# Patient Record
Sex: Female | Born: 1960 | Race: White | Hispanic: No | Marital: Married | Smoking: Never smoker
Health system: Southern US, Community
[De-identification: ages and names within clinical notes are randomized; demographics above are authoritative.]

## PROBLEM LIST (undated history)

## (undated) DIAGNOSIS — I341 Nonrheumatic mitral (valve) prolapse: Secondary | ICD-10-CM

## (undated) DIAGNOSIS — G8929 Other chronic pain: Secondary | ICD-10-CM

## (undated) DIAGNOSIS — Z87442 Personal history of urinary calculi: Secondary | ICD-10-CM

## (undated) DIAGNOSIS — I10 Essential (primary) hypertension: Secondary | ICD-10-CM

## (undated) DIAGNOSIS — R002 Palpitations: Secondary | ICD-10-CM

## (undated) DIAGNOSIS — I499 Cardiac arrhythmia, unspecified: Secondary | ICD-10-CM

## (undated) DIAGNOSIS — Z85828 Personal history of other malignant neoplasm of skin: Secondary | ICD-10-CM

## (undated) DIAGNOSIS — R32 Unspecified urinary incontinence: Secondary | ICD-10-CM

## (undated) DIAGNOSIS — T8859XA Other complications of anesthesia, initial encounter: Secondary | ICD-10-CM

## (undated) DIAGNOSIS — E039 Hypothyroidism, unspecified: Secondary | ICD-10-CM

## (undated) DIAGNOSIS — Z8679 Personal history of other diseases of the circulatory system: Secondary | ICD-10-CM

## (undated) DIAGNOSIS — Z9889 Other specified postprocedural states: Secondary | ICD-10-CM

## (undated) DIAGNOSIS — M549 Dorsalgia, unspecified: Secondary | ICD-10-CM

## (undated) DIAGNOSIS — J45909 Unspecified asthma, uncomplicated: Secondary | ICD-10-CM

## (undated) HISTORY — DX: Other specified postprocedural states: Z98.890

## (undated) HISTORY — PX: CYST REMOVAL HAND: SHX6279

## (undated) HISTORY — DX: Personal history of other diseases of the circulatory system: Z86.79

## (undated) HISTORY — PX: ABDOMINAL HYSTERECTOMY: SHX81

## (undated) HISTORY — PX: BACK SURGERY: SHX140

## (undated) HISTORY — PX: KNEE ARTHROSCOPY: SUR90

## (undated) HISTORY — PX: OTHER SURGICAL HISTORY: SHX169

## (undated) HISTORY — DX: Essential (primary) hypertension: I10

## (undated) HISTORY — PX: AUGMENTATION MAMMAPLASTY: SUR837

## (undated) HISTORY — PX: CHOLECYSTECTOMY: SHX55

## (undated) HISTORY — DX: Palpitations: R00.2

## (undated) HISTORY — DX: Unspecified urinary incontinence: R32

## (undated) HISTORY — DX: Personal history of other malignant neoplasm of skin: Z85.828

---

## 2001-12-04 ENCOUNTER — Ambulatory Visit (HOSPITAL_COMMUNITY): Admission: RE | Admit: 2001-12-04 | Discharge: 2001-12-04 | Payer: Self-pay | Admitting: Obstetrics and Gynecology

## 2001-12-04 ENCOUNTER — Encounter: Payer: Self-pay | Admitting: Obstetrics and Gynecology

## 2002-03-10 ENCOUNTER — Encounter: Payer: Self-pay | Admitting: Emergency Medicine

## 2002-03-10 ENCOUNTER — Emergency Department (HOSPITAL_COMMUNITY): Admission: EM | Admit: 2002-03-10 | Discharge: 2002-03-10 | Payer: Self-pay | Admitting: Emergency Medicine

## 2002-03-20 ENCOUNTER — Encounter (HOSPITAL_COMMUNITY): Admission: RE | Admit: 2002-03-20 | Discharge: 2002-04-19 | Payer: Self-pay | Admitting: Preventative Medicine

## 2002-05-23 ENCOUNTER — Encounter (INDEPENDENT_AMBULATORY_CARE_PROVIDER_SITE_OTHER): Payer: Self-pay | Admitting: *Deleted

## 2002-05-23 ENCOUNTER — Ambulatory Visit (HOSPITAL_BASED_OUTPATIENT_CLINIC_OR_DEPARTMENT_OTHER): Admission: RE | Admit: 2002-05-23 | Discharge: 2002-05-23 | Payer: Self-pay | Admitting: *Deleted

## 2002-12-27 ENCOUNTER — Emergency Department (HOSPITAL_COMMUNITY): Admission: EM | Admit: 2002-12-27 | Discharge: 2002-12-28 | Payer: Self-pay | Admitting: Emergency Medicine

## 2002-12-28 ENCOUNTER — Encounter: Payer: Self-pay | Admitting: Emergency Medicine

## 2003-10-08 ENCOUNTER — Ambulatory Visit (HOSPITAL_COMMUNITY): Admission: RE | Admit: 2003-10-08 | Discharge: 2003-10-08 | Payer: Self-pay | Admitting: Family Medicine

## 2004-04-02 ENCOUNTER — Ambulatory Visit (HOSPITAL_COMMUNITY): Admission: RE | Admit: 2004-04-02 | Discharge: 2004-04-02 | Payer: Self-pay | Admitting: Internal Medicine

## 2004-05-12 ENCOUNTER — Ambulatory Visit (HOSPITAL_COMMUNITY): Admission: RE | Admit: 2004-05-12 | Discharge: 2004-05-12 | Payer: Self-pay | Admitting: Internal Medicine

## 2004-05-13 ENCOUNTER — Emergency Department (HOSPITAL_COMMUNITY): Admission: EM | Admit: 2004-05-13 | Discharge: 2004-05-14 | Payer: Self-pay | Admitting: Emergency Medicine

## 2004-06-15 ENCOUNTER — Ambulatory Visit: Payer: Self-pay | Admitting: Internal Medicine

## 2004-12-14 ENCOUNTER — Ambulatory Visit (HOSPITAL_COMMUNITY): Admission: RE | Admit: 2004-12-14 | Discharge: 2004-12-14 | Payer: Self-pay | Admitting: Podiatry

## 2006-08-08 ENCOUNTER — Ambulatory Visit: Payer: Self-pay | Admitting: Internal Medicine

## 2006-08-16 ENCOUNTER — Ambulatory Visit: Payer: Self-pay | Admitting: Internal Medicine

## 2006-08-16 ENCOUNTER — Ambulatory Visit (HOSPITAL_COMMUNITY): Admission: RE | Admit: 2006-08-16 | Discharge: 2006-08-16 | Payer: Self-pay | Admitting: Internal Medicine

## 2006-09-16 ENCOUNTER — Ambulatory Visit: Payer: Self-pay | Admitting: Cardiology

## 2006-11-01 ENCOUNTER — Ambulatory Visit: Payer: Self-pay | Admitting: Cardiology

## 2006-11-01 ENCOUNTER — Ambulatory Visit (HOSPITAL_COMMUNITY): Admission: RE | Admit: 2006-11-01 | Discharge: 2006-11-01 | Payer: Self-pay | Admitting: Cardiology

## 2006-12-01 ENCOUNTER — Ambulatory Visit: Payer: Self-pay | Admitting: Cardiology

## 2007-04-05 ENCOUNTER — Ambulatory Visit (HOSPITAL_COMMUNITY): Admission: RE | Admit: 2007-04-05 | Discharge: 2007-04-05 | Payer: Self-pay | Admitting: Family Medicine

## 2007-04-24 ENCOUNTER — Ambulatory Visit (HOSPITAL_COMMUNITY): Admission: RE | Admit: 2007-04-24 | Discharge: 2007-04-25 | Payer: Self-pay | Admitting: Neurosurgery

## 2007-06-30 ENCOUNTER — Ambulatory Visit: Payer: Self-pay | Admitting: Cardiology

## 2007-08-31 ENCOUNTER — Inpatient Hospital Stay (HOSPITAL_COMMUNITY): Admission: RE | Admit: 2007-08-31 | Discharge: 2007-09-01 | Payer: Self-pay | Admitting: Neurosurgery

## 2007-10-01 ENCOUNTER — Emergency Department (HOSPITAL_COMMUNITY): Admission: EM | Admit: 2007-10-01 | Discharge: 2007-10-01 | Payer: Self-pay | Admitting: Emergency Medicine

## 2008-06-03 ENCOUNTER — Emergency Department (HOSPITAL_COMMUNITY): Admission: EM | Admit: 2008-06-03 | Discharge: 2008-06-03 | Payer: Self-pay | Admitting: Emergency Medicine

## 2008-07-23 ENCOUNTER — Other Ambulatory Visit: Admission: RE | Admit: 2008-07-23 | Discharge: 2008-07-23 | Payer: Self-pay | Admitting: Obstetrics and Gynecology

## 2009-01-09 ENCOUNTER — Telehealth (INDEPENDENT_AMBULATORY_CARE_PROVIDER_SITE_OTHER): Payer: Self-pay

## 2009-06-25 ENCOUNTER — Ambulatory Visit (HOSPITAL_COMMUNITY): Admission: RE | Admit: 2009-06-25 | Discharge: 2009-06-25 | Payer: Self-pay | Admitting: Family Medicine

## 2009-07-04 ENCOUNTER — Ambulatory Visit (HOSPITAL_COMMUNITY): Admission: RE | Admit: 2009-07-04 | Discharge: 2009-07-04 | Payer: Self-pay | Admitting: Family Medicine

## 2009-07-26 DIAGNOSIS — Z9889 Other specified postprocedural states: Secondary | ICD-10-CM

## 2009-07-26 HISTORY — DX: Other specified postprocedural states: Z98.890

## 2009-11-23 HISTORY — PX: CARDIAC CATHETERIZATION: SHX172

## 2009-11-24 ENCOUNTER — Ambulatory Visit (HOSPITAL_COMMUNITY): Admission: RE | Admit: 2009-11-24 | Discharge: 2009-11-24 | Payer: Self-pay | Admitting: Cardiology

## 2009-11-25 ENCOUNTER — Ambulatory Visit (HOSPITAL_COMMUNITY): Admission: RE | Admit: 2009-11-25 | Discharge: 2009-11-25 | Payer: Self-pay | Admitting: Cardiology

## 2009-12-17 ENCOUNTER — Encounter (INDEPENDENT_AMBULATORY_CARE_PROVIDER_SITE_OTHER): Payer: Self-pay | Admitting: Cardiology

## 2009-12-17 ENCOUNTER — Other Ambulatory Visit: Admission: RE | Admit: 2009-12-17 | Discharge: 2009-12-17 | Payer: Self-pay | Admitting: Obstetrics and Gynecology

## 2009-12-29 ENCOUNTER — Ambulatory Visit (HOSPITAL_COMMUNITY): Admission: RE | Admit: 2009-12-29 | Discharge: 2009-12-29 | Payer: Self-pay | Admitting: Family Medicine

## 2010-02-16 ENCOUNTER — Encounter (HOSPITAL_COMMUNITY): Admission: RE | Admit: 2010-02-16 | Discharge: 2010-03-18 | Payer: Self-pay | Admitting: Endocrinology

## 2010-08-16 ENCOUNTER — Encounter: Payer: Self-pay | Admitting: Family Medicine

## 2010-12-08 NOTE — H&P (Signed)
[name], [name]:  [id number]   MEDICAL RECORD NO.:  [id number]          PATIENT TYPE:  INP   LOCATION:  3035                         FACILITY:  [facility]   PHYSICIAN:  Hewitt Shorts, M.D.DATE OF BIRTH:  April 08, 1961   DATE OF ADMISSION:  08/31/2007  DATE OF DISCHARGE:                              HISTORY & PHYSICAL   HISTORY OF PRESENT ILLNESS:  The patient is a 50 year old right-handed  white female who has been a patient of mine for nearly 17 years.  She  underwent a right L5-S1 diskectomy in April 1992.  She suffered a  recurrent disk herniation and underwent diskectomy again in September  2008.  Unfortunately, although she did well initially, she suffered  recurrent radicular pain once again, this time not only across her low  back but also into both lower extremities.  MRI was repeated and  revealed a recurrent right L5-S1 lumbar disk herniation.  X-rays with  flexion and extension views show dynamic fishmouthing of the anterior  aspect of the L5-S1 disk space, and she is readmitted for L5-S1 lumbar  decompression plus POA.   PAST MEDICAL HISTORY:  Notable for history of hypertension.  She was  started on treatment  last year.  No history of myocardial infarction,  cancer, stroke, diabetes, peptic ulcer disease, or lung disease.  Previous surgeries include two right L5-S1 diskectomies in April 1992  and September 2008.  She had knee surgery and gallbladder surgery.  She  has intolerances but not allergies to morphine and Phenergan, both of  which cause nausea and vomiting.   CURRENT MEDICATIONS:  Include Benicar 20 mg daily for hypertension,  loratadine 10 mg daily for allergies, and pain medication.   FAMILY HISTORY:  Parents are both disabled.  Mother is age 92 and father  is age 61.  Father had open heart surgery, neck surgery, and back  surgery.  There is also family history of hypertension.   SOCIAL HISTORY:  The patient is married.  She is  not working.  She does  care for her 79-month-old granddaughter.  She does not smoke nor does  she have any history of substance abuse.   REVIEW OF SYSTEMS:  Notable for as described in the history of present  illness and past medical history but a 14-point review of systems is  otherwise unremarkable.   PHYSICAL EXAMINATION:  GENERAL:  The patient is a well-developed and  well-nourished white female, in no acute distress.  VITAL SIGNS:  Temperature 98.2, pulse 72, blood pressure 130/88,  respiratory rate 16, height 5 feet 5 inches, and weight 138 pounds.  LUNGS:  Clear to auscultation.  She has symmetrical respiratory  excursion.  HEART:  Regular rate and rhythm.  Normal S1 and S2.  There is no murmur.  ABDOMEN:  Soft and nondistended.  Bowel sounds are present.  EXTREMITIES:  Show no clubbing, cyanosis, or edema.  MUSCULOSKELETAL:  Musculoskeletal examination shows limitation in  mobility due to discomfort.  NEUROLOGIC:  Neurologic examination shows 5/5 strength in lower  extremities including the iliopsoas, quadriceps, dorsal, extensor,  hallucis longus,  and plantar flexors bilaterally, although she does tend  to give away with the right lower extremity due to pain.  Sensation is  intact to pinprick.  Reflexes are minimal in the biceps, brachialis,  triceps, 1 in the quadriceps, and minimal in gastrocnemius, symmetric  bilaterally.  Toes are downgoing bilaterally.   IMPRESSION:  Recurrent right L5-S1 lumbar disk herniation with evidence  of instability at L5-S1.   PLAN:  The patient admitted for a bilateral L5-S1 lumbar laminotomy,  facetectomy, diskectomy, posterior lumbar interbody fusion, and  posterolateral arthrodesis with interbody implants, posterior  instrumentation, and bone graft.  We discussed alternatives of the  surgery, alternative surgical procedures, difficulties with surgery,  hospital stay, overall occupational limitations, postoperative need for  possible  immobilization due to lumbar corset and risks including risk of  infection, bleeding, possibility of transfusion, risk of nerve root  dysfunction, pain, weakness, numbness or paresthesias, and the risk of  dural tear, CSF leakage, possible need for further surgery, anesthetic  risk, myocardial infarction, stroke, or even death.  Understanding all  these, she wishes to go ahead with surgery and is admitted for such.      Hewitt Shorts, M.D.  Electronically Signed     RWN/MEDQ  D:  08/31/2007  T:  08/31/2007  Job:  [id number]

## 2010-12-08 NOTE — Letter (Signed)
June 30, 2007    Patrica Duel, M.D.  [address]  [place], [place] [address]   RE:  [name], [name]  MRN:  [id number]  /  DOB:  [birth year]   Dear [name]:   [name] returns to the office for continued assessment and  treatment of hypertension and chest pain. The latter is not currently a  problem. She is complaining more of easy fatigability and malaise. This  has followed repeat lumbosacral spine surgery in September 2008 and an  episode of sinusitis approximately a month ago. She has not returned to  her usual activity. She feels as if she has gained weight.   CURRENT MEDICATIONS:  1. Benicar.  2. Loratadine.   PHYSICAL EXAMINATION:  VITAL SIGNS:  Blood pressure 140/70 in both arms,  although the pressure was 150 palpable in the right arm.  CARDIAC:  There is a split first heart sound and a modest systolic  ejection murmur.  LUNGS:  Clear.  EXTREMITIES:  There is no peripheral edema.   IMPRESSION:  [name] is doing well overall. Her fatigue is probably  related to physical inactivity and recovering from multiple medical  issues. She will try to gradually increase her activity. We will check a  TSH and a chemistry profile. A recent CBC was normal. Blood pressure  control appears adequate at the present time. The patient is concerned  whenever her systolics decrease below 130, so I think this is about  where we need to be.   Please let me know at any time that I can offer further assistance in  this care of this nice young woman.    Sincerely,      Gerrit Friends. Dietrich Pates, MD, [facility]  Electronically Signed    RMR/MedQ  DD: 06/30/2007  DT: 07/01/2007  Job #: [id number]

## 2010-12-08 NOTE — Op Note (Signed)
[name], [name]               ACCOUNT NO.:  [id number]   MEDICAL RECORD NO.:  [id number]          PATIENT TYPE:  OIB   LOCATION:  5114                         FACILITY:  [facility]   PHYSICIAN:  Hewitt Shorts, M.D.DATE OF BIRTH:  11-Oct-1960   DATE OF PROCEDURE:  DATE OF DISCHARGE:                               OPERATIVE REPORT   PREOPERATIVE DIAGNOSES:  1. Right L5-S1 recurrent lumbar disk herniation.  2. Lumbar degenerative disk disease.  3. Lumbar spondylosis.  4. Lumbar radiculopathy.   POSTOPERATIVE DIAGNOSES:  1. Right L5-S1 recurrent lumbar disk herniation.  2. Lumbar degenerative disk disease.  3. Lumbar spondylosis.  4. Lumbar radiculopathy.   PROCEDURE:  Right L5-S1 laminotomy and microdiskectomy with  microdissection.   SURGEON:  Hewitt Shorts, M.D.   ASSISTANT:  Jerald Kief, NP and Cristi Loron, M.D.   ANESTHESIA:  General endotracheal.   INDICATIONS FOR PROCEDURE:  Patient is a 50 year old woman who is 16  years status post a right L5-S1 lumbar diskectomy.  She did well from  that.  She developed recurrent radicular pain and was found to have a  recurrent disk herniation.  A decision was made to proceed with  laminotomy and microdiskectomy.   PROCEDURE:  Patient was brought to the operating room and placed under  general endotracheal anesthesia.  The patient was returned to a prone  position.  Lumbar region was prepped with Betadine and saline solution  and draped in a sterile fashion.  The midline was infiltrated with local  anesthetic with epinephrine.  An x-ray was taken. The L5-S1 level was  identified and a midline incision was made at the L5-S1 level and  carried down through the subcutaneous tissue.  Bipolar cautery and  electrocautery was used to maintain hemostasis.  Dissection was carried  down to the lumbar fascia was incised.  On the right side of the  midline, the paraspinal muscles were dissected  from the spinous  processes and  lamina in a subperiosteal fashion.  We identified the L5-  S1 interlaminar space and then x-ray was taken to confirm the  localization and then the microscope was draped and brought into field  to provide additional magnification, illumination and visualization.  The remainder of the decompression was performed using microdissection  and microsurgical technique.  We carefully dissected the scar tissue  from the lamina of L5 and S1 and we defined the previous laminotomy  defect.  We began to dissect the scar tissue from the edges of the  laminotomy and then we extended the laminotomy rostrally with the XMax  drill and Kerrison punches.  We got a fresh dissection plane and we were  able to dissect caudally, exposing the thecal sac and exiting right S1  nerve root.  These structures were dissected from the residual epidural  scar and mobilized medially.  We identified the disk herniation.  The  remaining scar tissue over the retained fragments was incised and the  fragment extruded.  Using a microhook, we were able to further remove  additional fragments of disk material.  Then we entered into the disk  space.  It was narrowed posteriorly.  We, using a variety of pituitary  rongeurs, completed a thorough diskectomy of all loose fragments of disk  material from both the disk spaces and the epidural space.  Good  decompression of the thecal sac and nerve root was achieved.  Hemostasis  was accomplished with the use of bipolar cautery and Gelfoam soaked in  thrombin.  The Gelfoam was subsequently removed.  The wound was  irrigated.  Hemostasis was confirmed.  We again examined the epidural  space and disk space.  All loose fragments of disk material were removed  and then we instilled 2 mL of Fentanyl, 80 mg of Depo-Medrol into the  epidural space and proceeded with closure.  The deep fascia was closed  with interrupted undyed 1 Vicryl sutures.  Scarpa's fascia was closed  with interrupted  undyed 1 Vicryl sutures.  The subcutaneous and  subcuticular layer were closed with interrupted inverted 2-0 undyed  Vicryl sutures and skin was reapproximated with Dermabond.  The  procedure was tolerated the well.  The estimated blood loss was less  than 25 mL.  Sponge and needle count were correct.  Following surgery,  the patient was turned back into the supine position to be reversed from  the anesthetic, extubated, and transferred to the recovery room for  further care.      Hewitt Shorts, M.D.  Electronically Signed     RWN/MEDQ  D:  04/24/2007  T:  04/24/2007  Job:  [id number]   cc:   Hewitt Shorts, M.D.

## 2010-12-08 NOTE — Op Note (Signed)
[name], [name]:  [id number]   MEDICAL RECORD NO.:  [id number]          PATIENT TYPE:  INP   LOCATION:  3035                         FACILITY:  [facility]   PHYSICIAN:  Hewitt Shorts, M.D.DATE OF BIRTH:  May 05, 1961   DATE OF PROCEDURE:  DATE OF DISCHARGE:                               OPERATIVE REPORT   PREOPERATIVE DIAGNOSES:  1. Recurrent right L5-S1 lumbar disk herniation.  2. Lumbar degenerative disk disease.  3. Lumbar spondylosis.  4. Lumbar radiculopathy.   POSTOPERATIVE DIAGNOSES:  1. Recurrent right L5-S1 lumbar disk herniation.  2. Lumbar degenerative disk disease.  3. Lumbar spondylosis.  4. Lumbar radiculopathy.   PROCEDURE:  Right L5-S1 lumbar laminotomy, facetectomy, foraminotomy  with decompression of the right L5 and right S1 nerve roots with  microdissection, right L5-S1 transverse posterior lumbar interbody  fusion with AVS PEEK interbody implant and mosaic with bone marrow  aspirate and, bilateral L5-S1 posterolateral arthrodesis with Tectonix  post instrumentation locally harvested, morselized autograft and mosaic  with bone marrow aspirate.   SURGEON:  Hewitt Shorts, M.D.   ASSISTANT:  Tanya Nones. Jeral Fruit, M.D.   ANESTHESIA:  General endotracheal.   INDICATIONS:  The patient is a 50 year old woman who suffered a  recurrent right L5-S1 lumbar disk herniation.  Her previous surgery was  on September 2008 and 1992.  Her x-rays revealed dynamic motion at the  L5-S1 disk space, and a decision was made to proceed with decompression  and arthrodesis.   PROCEDURE:  The patient was brought to the operating room, turned to the  prone position, lumbar region was prepped with Betadine scrub and  solution, draped with a sterile fashion.  The midline was infiltrated  with local anesthetic with epinephrine and a midline incision was made  over the L5-S1 level and carried down through the subcutaneous tissue.  Bipolar  electrocautery was used to maintain hemostasis.  The dissection  was carried out from the lumbar fascia which was incised bilaterally,  and then the paraspinal muscles were dissencted from the spinous  processes and lamina in a subperiosteal fashion.  The right L5 S1  laminotomy was noted, and we carefully dissected around the laminotomy  and then we defined the facet joints bilaterally and with magnification  and microdissection.  The right L5-S1 laminotomy was extended rostraly  and caudally, and a medial facetectomy was performed as well.  We were  able to carefully dissect the scar tissue in the epidural space and  exposed the thecal sac and exiting right L5 and S1 nerve roots.  We then  dissected in the epidural space to the disk space and entered into the  disk space and began a discectomy, the disk space was markedly narrowed.  As we did so, we were able to gradually remove the herniated fragment  that was embedded within the scar tissue more medially, and thereby  decompressed the right L5 and right S1 nerve roots as well as thecal  sac.  We then spent an extensive amount of time, continuing the  discectomy and preparing the disk space for interbody fusion.  There was  marked spondylitic degeneration, this was removed using pituitary  rongeurs and Epstein curettes, Paddle curettes and the XMax drill.  We  then began to size the disk space, initially sizing a 7 implant,  eventually an 8 implant, and finally a 9 mm implant.  We then brought in  the C-arm fluoroscope and identified pedicle entry sites, the left S1  pedicle was probed and we aspirated bone marrow aspirate from the S1  vertebral body, and it was injected over 50 mL strip of Mozaic and then  a 9 mm x 25 mm x 4 degree PEEK implant and was packed with mosaic with  bone marrow aspirate, and then we carefully positioned the implant in  the intervertebral disk space and carefully retracted the thecal sac and  nerve roots.   The implant was carefully turned and then we packed  additional mosaic with bone marrow aspirate in the intervertebral disk  space in and around the PEEK implant.   We then continued with a posterolateral arthrodesis with C-arm  fluoroscopic guidance identifying the pedicle entry sites at L5 and S1  bilaterally.  We selected a short 1 level Techtonic plate to place  screws, first at the L5 level each pedicle was probed, examined of the  ball probe, good bony surfaces were noted, it was tapped for a 6-mm  screw, and then we placed a 6 mm x 40 mm screws at the L5 level,  fastened through the upper hole of the plate, these were tightened down  and  we positioned the lower portion of the plate over the sacrum.  We  then selected the pedicle entry sites in the sacrum, each these was  probed, examining the ball probe with good bony surfaces were noted, we  then tapped and them and then again examined the ball probe and then  placed 6 x 30 mm screws at the sacrum.  All four screws were fully  tightened down and then locking caps were placed over the screws and  these were tightened with a torque wrench.  We had previously  decorticated the facet joint and lamina on the left side as well as the  facet joints on the right side, these were packed with mosaic with bone  marrow aspirate as well as morselized locally harvested autograft that  had been obtained while performing the laminectomy.  We then proceeded  with closure.  The wounds had been irrigated numerous times in the  procedure, initial with saline solution, subsequently with bacitracin  solution.  The deep fascia closed with interrupted untied with 1 Vicryl  suture, the subcutaneous subcuticular was closed with interrupted  inverted 2-0 Vicryl sutures, and the skin edges were approximated with  Dermabond.  The procedure tolerated well.  Estimated blood loss was 250  mL.  We did use a Cell-Saver during the procedure, but the Cell-Saver   technician felt that there was insufficient blood loss to process the  collected blood.  Sponge and needle count were correct.  Following the  surgery, the patient was turned back to supine position to be reversed  of the anesthetic, extubated and transferred to recovery room for the  care.      Hewitt Shorts, M.D.  Electronically Signed     RWN/MEDQ  D:  08/31/2007  T:  09/01/2007  Job:  [id number]

## 2010-12-11 NOTE — Letter (Signed)
November 01, 2006    Patrica Duel, M.D.  [address]  [place], [place] [address]   RE:  MARVETTA, VOHS  MRN:  [id number]  /  DOB:  1960-07-30   Dear Loraine Leriche:   Ms. Liberati returned to the office for continued assessment and  treatment of chest discomfort, dyspnea, and hypertension.  Since last  visit, she has done generally well.  She describes herself as extremely  active during the course of the day.  She has had no recurrent chest  discomfort.  The sensation of dyspnea continues to be limited to times  when she is singing in church.   She brings in a list of blood pressures that have been generally well  controlled.  She developed orthostatic light-headedness while taking  hydrochlorothiazide, which was discontinued.  She is now taking Benicar  20 mg daily.  Her only other medications are loratadine and a course of  azithromycin.   EXAM:  Pleasant, trim woman in no acute distress.  Weight is 129, 2 pounds more than at her last visit.  Blood pressure  150/90 in the right arm and 158/90 in the left arm sitting.  Heart rate  80 and regular.  Blood pressure in her leg is 160/92.  NECK:  No jugular venous distension.  LUNGS:  Clear.  CARDIAC:  Normal first and second heart sounds; minimal systolic  ejection murmur.   IMPRESSION:  Ms. Maves has hypertension that still is not optimally  controlled.  She does not likely have a diagnosable etiology for her  elevated blood pressure.  There is an appreciable discrepancy in blood  pressure between the 2 arms, but this is not dramatic.  In any case,  aortic coarctation would likely result in a lower blood pressure in the  left arm rather than in the right arm.  She is unlikely to have primary  vascular disease at her age with no significant risk factors, other than  new onset hypertension.  A chest x-ray will be obtained to rule out  intrathoracic disease.  She will increase her dose of Benicar to 20 mg  daily.  If this is  not effective, we will add 6.25 mg of diuretic, which  should not result in orthostatic issues.  Ms. Verbeek will continue to  monitor blood pressure at home and return to see the nurse in 1 month.  I will see her again in 6 months.  A urinalysis is also pending.  Other  laboratories were performed in your office and are negative.    Sincerely,      Gerrit Friends. Dietrich Pates, MD, [facility]  Electronically Signed    RMR/MedQ  DD: 11/01/2006  DT: 11/01/2006  Job #: [id number]

## 2010-12-11 NOTE — Op Note (Signed)
   NAMEYICEL, SHANNON                         ACCOUNT NO.:  [id number]   MEDICAL RECORD NO.:  [id number]                   PATIENT TYPE:  AMB   LOCATION:  DSC                                  FACILITY:  [facility]   PHYSICIAN:  Lowell Bouton, M.D.      DATE OF BIRTH:  1960-11-09   DATE OF PROCEDURE:  05/23/2002  DATE OF DISCHARGE:                                 OPERATIVE REPORT   PREOPERATIVE DIAGNOSIS:  Mass, right hand.   POSTOPERATIVE DIAGNOSIS:  Mass, right hand.  Contusion of sensory nerve,  right hand.   PROCEDURE:  Excision of mass, right hand.   SURGEON:  Lowell Bouton, M.D.   ANESTHESIA:  0.5% Marcaine local with sedation.   FINDINGS:  The patient had some thickened fat and neuroma-type tissue from a  contused sensory nerve. There was no evidence of any ganglion cyst or gross  abnormality.   DESCRIPTION OF PROCEDURE:  Under 0.5% Marcaine local anesthesia with a  tourniquet on the right arm, the right hand was prepped and draped in the  usual sterile fashion and after exsanguinating the limb, the tourniquet was  inflated to 250 mmHg.  A longitudinal incision was made over the fifth  metacarpal on the dorsum of the hand. Blunt dissection was carried through  the subcutaneous tissue and bleeding points were coagulated.  Blunt  dissection was carried down to the area of fullness and some subcutaneous  fat was removed.  There was no ganglion cyst identified and there was a  dorsal sensory nerve that had a small neuroma on a branch. This was excised  and sent to the lab.  Further evaluation did not identify any further masses  around the extensor tendon, even down to the fifth and fourth metacarpals.  The wound was then irrigated with saline and the skin was closed with a 3-0  subcuticular Prolene.  Steri-Strips were applied followed by a sterile  dressing.  The patient tolerated the procedure well and went to the recovery  room awake, stable, and in  good condition.                                               Lowell Bouton, M.D.    EMM/MEDQ  D:  05/23/2002  T:  05/23/2002  Job:  [id number]

## 2010-12-11 NOTE — Consult Note (Signed)
[name], [name]               ACCOUNT NO.:  [id number]   MEDICAL RECORD NO.:  [id number]           PATIENT TYPE:  AMB   LOCATION:  DAY                           FACILITY:  [facility]   PHYSICIAN:  R. Roetta Sessions, M.D. DATE OF BIRTH:  1960/08/08   DATE OF CONSULTATION:  DATE OF DISCHARGE:                                 CONSULTATION   REASON FOR CONSULTATION:  Hematochezia.   [name] is a pleasant 50 year old Caucasian female who has  noted some low volume hematochezia when she has a bowel movement over  the past 3 or 4 episodes in the past couple of months.  She denies  constipation, has one bowel movement daily.  Occasionally has crampy  right mid abdominal pain in association with eating and having a bowel  movement, sometimes she gets diaphoretic, but once she has the bowel  movement the pain instantly goes away.  She has a history of symptomatic  sphincter of Oddi dysfunction for which she underwent an ERCP with  sphincterotomy at [facility] in 2005.  Procedure was complicated by  pancreatitis which she managed at home.   She likened this intermittent pain somewhat to the symptoms she had  related to her sphincter of Oddi dysfunction.  Her gallbladder is out.  She has a positive family history of colon cancer in two second degree  relatives, in maternal grandfather and paternal grandmother diagnosed in  either their 12's or 45's.  She has never had her lower GI tract imaged.  She denies odynophagia, dysphagia or left sided reflux symptoms, nausea  or vomiting.  Her weight is down 2 pounds from what it was when we  originally saw her in October, 2005.   PAST MEDICAL HISTORY:  Unremarkable otherwise.   She has had multiple surgeries:  1. ERCP with sphincterotomy.  2. Right hand.  3. Left knee.  4. Right foot.  5. Back.  6. Hysterectomy.  7. Cholecystectomy.   CURRENT MEDICATIONS:  OTC __________ p.r.n.   ALLERGIES:  1. MORPHINE.  2. PHENERGAN.   FAMILY  HISTORY:  As outlined above.  Grandmother also ovarian carcinoma.  One grandparent had metastatic melanoma.  Mother has hypertension.  Father has some type of connective tissue disease.   SOCIAL HISTORY:  The patient has been married for 23 years.  She has one  healthy daughter and a granddaughter.  No tobacco, alcohol or drug use.   REVIEW OF SYSTEMS:  No chest pain, dyspnea on exertion.  No fever or  chills.  __________ urine.  She has had no melena.  She denies diarrhea.   EXAM:  Today she looks well.  Weight 126, height 5 feet 5 inches, temp  98, BP 118/82, pulse 80.  Skin warm and dry.  HEENT:  No scleral icterus.  CHEST:  Lungs are clear to auscultation.  CARDIAC:  Regular rate and rhythm without murmur, gallop or rub.  ABDOMEN:  Nondistended, positive bowel sounds.  Soft, nontender, without  appreciable mass or organomegaly.  EXTREMITY:  No edema.  RECTAL:  Deferred until to the time of colonoscopy.  IMPRESSION:  [name] is a pleasant 50 year old lady with  intermittent low volume hematochezia recently in the setting of two  second degree relatives with colorectal cancer at a relatively young age  and this lady needs to have a colonoscopy.  I discussed this approach  with [name].  The potential risks, benefits and alternatives have  been reviewed, she is agreeable.  Will plan to perform colonoscopy  forthwith.  She has had some nonspecific right sided abdominal pain  temporarily relieved with a bowel movement over the past few months.  This does not really sound like recurrent biliary symptoms to me,  although Mrs. Barraclough is wondering about it, will go ahead and at least  do some baseline laboratories today and see what is found at colonoscopy  then make further recommendations.   I want to thank Dr. Yetta Numbers for letting me see this very nice lady.      Jonathon Bellows, M.D.  Electronically Signed     RMR/MEDQ  D:  08/08/2006  T:  08/08/2006   Job:  [id number]   cc:   Kirk Ruths, M.D.  Fax: [phone]

## 2010-12-11 NOTE — H&P (Signed)
NAMEANAILY, [name]               ACCOUNT NO.:  [id number]   MEDICAL RECORD NO.:  [id number]          PATIENT TYPE:  AMB   LOCATION:  DAY                           FACILITY:  [facility]   PHYSICIAN:  Oley Balm. Pricilla Holm, D.P.M.DATE OF BIRTH:  01-13-1961   DATE OF ADMISSION:  12/14/2004  DATE OF DISCHARGE:  LH                                HISTORY & PHYSICAL   HISTORY OF PRESENT ILLNESS:  [name] is a 50 year old white female who  has had a chief complaint of painful bunion deformity of her right foot.  The patient relates that she has difficulty wearing enclosed shoes.  She has  pain over the first MTP, both dorsally and medially.   PREVIOUS HOSPITALIZATIONS AND SURGERY:  1.  Childbirth times one.  2.  Hysterectomy.  3.  Cholecystectomy.  4.  Knee surgery times one.   MEDICATIONS:  She takes vitamins.   ALLERGIES:  MORPHINE.   TRANSFUSIONS:  No transfusions or hepatitis.   REVIEW OF SYSTEMS:  Uneventful.   PHYSICAL EXAMINATION:  LOWER EXTREMITIES:  Palpable pedal pulses, both DP  and PT, with spontaneous capillary filling time.  NEUROLOGIC:  Essentially within normal limits.  MUSCULOSKELETAL:  Pain to palpation over the first MTP of the right foot.  She has pain both medially and dorsally.   X-rays reveal not a significant hallux valgus deformity, but on standing  clinically she does have protrusion of the first metatarsal head.   ASSESSMENT:  Hallux valgus deformity, right foot.   PLAN:  The patient to undergo McBride bunionectomy.  Reviewed the procedure  with the patient, including complications of the procedure such as  infection, bone infection, postoperative pain, swelling, etc.  The patient  seems to understand the same, and again surgery has been scheduled for Dec 14, 2004.       DBT/MEDQ  D:  12/13/2004  T:  12/13/2004  Job:  [id number]

## 2010-12-11 NOTE — Letter (Signed)
September 16, 2006    Lazaro Arms, M.D.  [address]  [place], [place] [address]   RE:  TRENELL, MOXEY  MRN:  [id number]  /  DOB:  23-Jun-1961   Dear Franky Macho:   It was pleasure assessing Ms. Parlier in the office today at your  request.  As you know, the nice woman has recently been noted to have  mild hypertension.  She also has a one year history of chest discomfort.  She describes a pressure and burning sensation at the left costal margin  that radiates through to the back.  There is no associated dyspnea nor  diaphoresis.  She has had nausea accompany these episodes.  The most  recent was approximately a month ago when she lay on the floor for 3  hours before she felt like she could resume her usual activity.   Ms. Sando reports a number of other issues.  She notes a sense of  dyspnea when she sings in choir.  She has had intermittent headaches.  She had an episode of numbness over the left face and neck.   She reports having a generally active lifestyle, doing all of her  housework and yard work, as well as frequently chasing after kids.   Past medical history is notable for a number of surgeries.  She has had  a hysterectomy but believes that both her cervix and ovaries were left  in place.  She has undergone cholecystectomy, L5-S1 fusion and  orthopedic procedures on her knee and right hand.   An allergy to MORPHINE is reported.  The patient takes no medications  routinely.  She developed breast cysts when she took a course of  estrogen some years ago.   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  Frequently spends  time with her young grandchild.   FAMILY HISTORY:  Father is a patient of mine who has undergone CABG  surgery.  Mother is alive and healthy.  Brother is alive and healthy but  has hypertension.   REVIEW OF SYSTEMS:  Notable for the need for corrective lenses,  occasional palpitations, a history of sludging in her gallbladder that  required ERCP, and presumably papillotomy.   PHYSICAL EXAMINATION:  On exam, youthful appearing, trim woman in no  acute distress.  The weight is 127, blood pressure 140/100, heart rate  75 and regular, respirations 16.  HEENT:  Normal funduscopic exam; normal lids and conjunctiva; normal  oral mucosa.  NECK:  No jugular venous distention; normal carotid upstrokes without  bruits.  ENDOCRINE:  No thyromegaly.  HEMATOPOIETIC:  No adenopathy.  SKIN:  No significant lesions.  CARDIAC:  Normal first and second heart sounds; modest systolic ejection  murmur.  LUNGS:  Clear.  ABDOMEN:  Soft and nontender; no masses; no organomegaly; normal bowel  sounds; small healed incision in the umbilicus.  EXTREMITIES:  Normal distal pulses; no edema.  NEUROMUSCULAR:  Symmetric strength and tone; normal cranial nerves.   EKG:  Normal sinus rhythm; borderline left atrial abnormality; right  ventricular conduction delay; otherwise unremarkable.   IMPRESSION:  Ms. Cottone has minimal risk.  She experienced symptoms a  few years ago that were compatible with the onset of menopause.  She  also has mild hypertension.  She will begin to take the diuretic that  you recently prescribed for her to treat the latter.  She may well need  another drug -- addition of an ACE inhibitor or ARB would be  appropriate.   Her symptoms of chest and back discomfort associated with nausea sound  more GI than cardiac.  Moreover, her risk of coronary disease is quite  low, and she has no exertional symptoms.  Leading diagnoses would be IBS  and esophageal spasm.  We will treat her empirically with antacids and  sublingual nitroglycerine for recurrent episodes.  I will reassess this  nice woman's symptoms in 6 weeks.  Thanks for sending her to me.    Sincerely,     Gerrit Friends. Dietrich Pates, MD, [facility]  Electronically Signed   RMR/MedQ  DD: 09/16/2006  DT: 09/16/2006  Job #: [id number]   CC:    Patrica Duel, M.D.

## 2010-12-11 NOTE — Op Note (Signed)
[name], [name]               ACCOUNT NO.:  [id number]   MEDICAL RECORD NO.:  [id number]          PATIENT TYPE:  AMB   LOCATION:  DAY                           FACILITY:  [facility]   PHYSICIAN:  R. Roetta Sessions, M.D. DATE OF BIRTH:  [birth year]   DATE OF PROCEDURE:  08/16/2006  DATE OF DISCHARGE:                               OPERATIVE REPORT   PROCEDURE:  Colonoscopy with ileoscopy, diagnostic.   INDICATIONS FOR PROCEDURE:  50 year old lady with intermittent low-  volume hematochezia in the setting of second-degree relative with colon  cancer.  She also has had some nonspecific right-sided abdominal pain  temporally revealed with having a bowel movement over the past few  months.  It does not look like she has had any recurrent biliary symptom  which she has had in the past.  Colonoscopy is now being done to further  evaluate her hematochezia.  This approach has been discussed with the  patient at length.  The potential risks, benefits, and alternatives have  been reviewed and questions answered.  She is agreeable.  Please see the  documentation in the medical record.   PROCEDURE NOTE:  O2 saturation, blood pressure, pulse, and respirations  were monitored throughout the entire procedure.  Conscious sedation was  with Versed 5 mg IV and Demerol 75 mg IV in divided doses.  The  instrument used was the Pentax video chip system.   FINDINGS:  Digital rectal exam revealed no abnormalities.   ENDOSCOPIC FINDINGS:  The prep was adequate.  The colonic mucosa was  surveyed from the rectosigmoid unction through the left, transverse,  right colon to the area of the appendiceal orifice, ileocecal valve, and  cecum.  These structures well-seen and photographed for the record.  The  terminal ileum was intubated to 10 cm.  From this level the scope was  slowly and cautiously withdrawn.  All previously mentioned mucosal  surfaces were again seen.  The colonic mucosa appeared normal.  The  terminal ileal mucosa appeared normal.  The scope was pulled down to the  rectum where thorough examination of the rectal mucosa and retroflex  view of the anal verge was undertaken.  The patient had anal canal  hemorrhoids; otherwise, the rectal mucosa appeared normal.  The patient  tolerated the procedure well and was reactive in endoscopy.   IMPRESSION:  Anal hemorrhoids; otherwise, normal rectum, colon, and  terminal ileum.   RECOMMENDATIONS:  1. Daily Benefiber fiber supplement, one tablespoon daily.  2. Hemorrhoid literature provided to Ms. Yehuda Mao.  3. A 10-day course of Anusol AC suppositories, one per rectum at      bedtime.  4. She is to have a screening colonoscopy when she turns 52.  5. Followup appointment with [place] in the office in 6 weeks.   ADDENDUM:  From our office, her H&H was 14.2 and 44.0, white count 3.2,  MCV 95.  LFTs:  Normal amylase, lipase at 50 and less than 10.      R. Roetta Sessions, M.D.  Electronically Signed     RMR/MEDQ  D:  08/16/2006  T:  08/16/2006  Job:  [id number]   cc:   Patrica Duel, M.D.  Fax: [phone]

## 2010-12-11 NOTE — Op Note (Signed)
NAMEJARELI, [name]               ACCOUNT NO.:  [id number]   MEDICAL RECORD NO.:  [id number]          PATIENT TYPE:  AMB   LOCATION:  DAY                           FACILITY:  [facility]   PHYSICIAN:  Oley Balm. Pricilla Holm, D.P.M.DATE OF BIRTH:  1961/07/23   DATE OF PROCEDURE:  12/14/2004  DATE OF DISCHARGE:                                 OPERATIVE REPORT   PREOPERATIVE DIAGNOSIS:  Hallux valgus deformity of the right foot, hallux  limitus deformity.   POSTOPERATIVE DIAGNOSIS:  Hallux valgus deformity of the right foot, hallux  limitus deformity.   PROCEDURE:  McBride bunionectomy with removal of cartilaginous osteophyte,  first metatarsophalangeal joints.   SURGEON:  Oley Balm. Pricilla Holm, D.P.M.   ANESTHESIA:  Monitored anesthesia care.   INDICATION FOR PROCEDURE:  Longstanding history of pain, first MTP.   Patient brought in the operating room and placed on the operating table in  the supine position.  The patient's lower right foot and leg was then  prepped and draped in the usual aseptic manner.  Then with an ankle  tourniquet placed and well-padded to prevent contusion and elevated at 250  mmHg, exsanguination of the right foot, the following surgical procedures  were then performed under monitored anesthesia care.   McBRIDE BUNIONECTOMY, RIGHT FOOT:  Attention directed to the dorsal medial  aspect of the right first MTP, where a linear incision made.  The incision  widened and deepened via sharp and blunt dissection, being sure to identify  and retract all vital structures.  A capsular incision was made in the  intermetatarsal crease soft tissue attachments dorsally and medially.  Then  utilizing a Zimmer oscillating saw, the medial limits on the medial aspect  of the first metatarsal was resected.  It should be noted that the  osteophyte formation that was dorsal was resected and again this appeared to  all be cartilaginous bone.  She had a large ridge of cartilaginous bone on  the  dorsal aspect.  This was resected.  All rough edges were rasped smooth,  the wound lavaged with a copious amount of sterile solution.  Capsular and  subcutaneous tissues approximated with a continuous running suture of 4-0  Dexon, the skin was approximated utilizing a running subcuticular suture of  4-0 Dexon.   All surgical sites were then infiltrated with approximately 1/8 mL of  dexamethasone phosphate and mild compressive bandages consisting of Betadine-  soaked Adaptic, sterile 4 x 4's, and sterile Kling were then applied.  The  patient tolerated the procedure well and left the operating room in apparent  good condition with vital signs stable to the recovery room.       DBT/MEDQ  D:  12/14/2004  T:  12/14/2004  Job:  [id number]

## 2011-04-15 LAB — BASIC METABOLIC PANEL
BUN: 14
CO2: 23
Calcium: 9.4
Chloride: 105
Creatinine, Ser: 0.53
GFR calc Af Amer: >60
GFR calc non Af Amer: >60
Glucose, Bld: 91
Potassium: 4.5
Sodium: 136

## 2011-04-15 LAB — TYPE AND SCREEN
ABO/RH(D): O NEG
Antibody Screen: NEGATIVE

## 2011-04-15 LAB — CBC
HCT: 43.5
Hemoglobin: 14.7
MCHC: 33.8
MCV: 93.9
Platelets: 183
RBC: 4.63
RDW: 11.8
WBC: 4.1

## 2011-04-15 LAB — ABO/RH: ABO/RH(D): O NEG

## 2011-04-19 LAB — DIFFERENTIAL
Basophils Absolute: 0
Lymphocytes Relative: 14
Neutro Abs: 6.4

## 2011-04-19 LAB — COMPREHENSIVE METABOLIC PANEL
ALT: 16
CO2: 26
Calcium: 8.2 — ABNORMAL LOW
Chloride: 106
Creatinine, Ser: 0.65
GFR calc Af Amer: >60
Sodium: 138

## 2011-04-19 LAB — CBC
MCHC: 34.6
MCV: 91.3
Platelets: 208
RBC: 4.42
WBC: 7.8

## 2011-04-27 LAB — DIFFERENTIAL
Basophils Absolute: 0
Eosinophils Relative: 1
Lymphocytes Relative: 35
Lymphs Abs: 1
Monocytes Absolute: 0.1
Monocytes Relative: 5

## 2011-04-27 LAB — COMPREHENSIVE METABOLIC PANEL
AST: 15
Albumin: 4.5
Chloride: 109
Creatinine, Ser: 0.65
GFR calc Af Amer: >60
Total Bilirubin: 0.8
Total Protein: 6.8

## 2011-04-27 LAB — URINALYSIS, ROUTINE W REFLEX MICROSCOPIC
Ketones, ur: NEGATIVE
Leukocytes, UA: NEGATIVE
Nitrite: NEGATIVE
Specific Gravity, Urine: 1.025
Urobilinogen, UA: 0.2
pH: 6

## 2011-04-27 LAB — CBC
MCV: 92.2
Platelets: 154
RDW: 12.6
WBC: 2.8 — ABNORMAL LOW

## 2011-04-27 LAB — URINE MICROSCOPIC-ADD ON

## 2011-04-27 LAB — POCT CARDIAC MARKERS: Troponin i, poc: <0.05

## 2011-05-06 LAB — DIFFERENTIAL
Basophils Absolute: 0
Basophils Relative: 0
Eosinophils Absolute: 0
Eosinophils Relative: 0
Lymphocytes Relative: 27
Lymphs Abs: 1.5
Monocytes Absolute: 0.3
Monocytes Relative: 6
Neutro Abs: 3.8
Neutrophils Relative %: 67

## 2011-05-06 LAB — CBC
HCT: 41.9
Hemoglobin: 14
MCHC: 33.4
MCV: 95.5
Platelets: 188
RBC: 4.39
RDW: 12.4
WBC: 5.7

## 2011-05-06 LAB — BASIC METABOLIC PANEL
BUN: 21
CO2: 26
Calcium: 9.3
Chloride: 102
Creatinine, Ser: 0.63
GFR calc Af Amer: >60
GFR calc non Af Amer: >60
Glucose, Bld: 94
Potassium: 4.4
Sodium: 137

## 2011-05-07 LAB — CREATININE, SERUM
Creatinine, Ser: 0.7
GFR calc non Af Amer: >60

## 2011-09-06 ENCOUNTER — Other Ambulatory Visit: Payer: Self-pay | Admitting: Adult Health

## 2011-09-06 ENCOUNTER — Other Ambulatory Visit (HOSPITAL_COMMUNITY)
Admission: RE | Admit: 2011-09-06 | Discharge: 2011-09-06 | Disposition: A | Discharge status: Home / Self Care | Payer: BC Managed Care – PPO | Source: Ambulatory Visit | Attending: Obstetrics and Gynecology | Admitting: Obstetrics and Gynecology

## 2011-09-06 DIAGNOSIS — E049 Nontoxic goiter, unspecified: Secondary | ICD-10-CM

## 2011-09-06 DIAGNOSIS — Z01419 Encounter for gynecological examination (general) (routine) without abnormal findings: Secondary | ICD-10-CM | POA: Insufficient documentation

## 2011-09-06 DIAGNOSIS — Z139 Encounter for screening, unspecified: Secondary | ICD-10-CM

## 2011-09-09 ENCOUNTER — Ambulatory Visit (HOSPITAL_COMMUNITY)
Admission: RE | Admit: 2011-09-09 | Discharge: 2011-09-09 | Disposition: A | Discharge status: Home / Self Care | Payer: BC Managed Care – PPO | Source: Ambulatory Visit | Attending: Adult Health | Admitting: Adult Health

## 2011-09-09 DIAGNOSIS — E049 Nontoxic goiter, unspecified: Secondary | ICD-10-CM | POA: Insufficient documentation

## 2011-09-09 DIAGNOSIS — Z139 Encounter for screening, unspecified: Secondary | ICD-10-CM

## 2011-09-09 DIAGNOSIS — Z1231 Encounter for screening mammogram for malignant neoplasm of breast: Secondary | ICD-10-CM | POA: Insufficient documentation

## 2012-06-20 ENCOUNTER — Emergency Department (HOSPITAL_COMMUNITY)
Admission: EM | Admit: 2012-06-20 | Discharge: 2012-06-20 | Disposition: A | Discharge status: Home / Self Care | Payer: BC Managed Care – PPO | Attending: Emergency Medicine | Admitting: Emergency Medicine

## 2012-06-20 ENCOUNTER — Encounter (HOSPITAL_COMMUNITY): Payer: Self-pay | Admitting: Emergency Medicine

## 2012-06-20 ENCOUNTER — Emergency Department (HOSPITAL_COMMUNITY): Payer: BC Managed Care – PPO

## 2012-06-20 DIAGNOSIS — N23 Unspecified renal colic: Secondary | ICD-10-CM

## 2012-06-20 DIAGNOSIS — N201 Calculus of ureter: Secondary | ICD-10-CM | POA: Insufficient documentation

## 2012-06-20 DIAGNOSIS — G8929 Other chronic pain: Secondary | ICD-10-CM | POA: Insufficient documentation

## 2012-06-20 DIAGNOSIS — Z3202 Encounter for pregnancy test, result negative: Secondary | ICD-10-CM | POA: Insufficient documentation

## 2012-06-20 DIAGNOSIS — Z87442 Personal history of urinary calculi: Secondary | ICD-10-CM | POA: Insufficient documentation

## 2012-06-20 DIAGNOSIS — I10 Essential (primary) hypertension: Secondary | ICD-10-CM | POA: Insufficient documentation

## 2012-06-20 DIAGNOSIS — R112 Nausea with vomiting, unspecified: Secondary | ICD-10-CM | POA: Insufficient documentation

## 2012-06-20 HISTORY — DX: Other chronic pain: G89.29

## 2012-06-20 HISTORY — DX: Dorsalgia, unspecified: M54.9

## 2012-06-20 HISTORY — DX: Essential (primary) hypertension: I10

## 2012-06-20 LAB — URINALYSIS, ROUTINE W REFLEX MICROSCOPIC
Glucose, UA: NEGATIVE mg/dL
Hgb urine dipstick: NEGATIVE
Leukocytes, UA: NEGATIVE
Protein, ur: NEGATIVE mg/dL
Specific Gravity, Urine: 1.01 (ref 1.005–1.030)
Urobilinogen, UA: 0.2 mg/dL (ref 0.0–1.0)

## 2012-06-20 MED ORDER — ONDANSETRON 8 MG PO TBDP
8.0000 mg | ORAL_TABLET | Freq: Once | ORAL | Status: AC | PRN
  Administered 2012-06-20: 8 mg via ORAL
  Filled 2012-06-20: qty 1

## 2012-06-20 MED ORDER — ONDANSETRON HCL 4 MG/2ML IJ SOLN
4.0000 mg | Freq: Once | INTRAMUSCULAR | Status: AC
  Administered 2012-06-20: 4 mg via INTRAVENOUS
  Filled 2012-06-20: qty 2

## 2012-06-20 MED ORDER — NAPROXEN 250 MG PO TABS: 250.0000 mg | ORAL_TABLET | Freq: Two times a day (BID) | ORAL | Status: DC

## 2012-06-20 MED ORDER — OXYCODONE-ACETAMINOPHEN 5-325 MG PO TABS
2.0000 | ORAL_TABLET | Freq: Once | ORAL | Status: AC
  Administered 2012-06-20: 2 via ORAL
  Filled 2012-06-20: qty 2

## 2012-06-20 MED ORDER — OXYCODONE-ACETAMINOPHEN 5-325 MG PO TABS: ORAL_TABLET | ORAL | Status: DC

## 2012-06-20 MED ORDER — KETOROLAC TROMETHAMINE 30 MG/ML IJ SOLN
30.0000 mg | Freq: Once | INTRAMUSCULAR | Status: AC
  Administered 2012-06-20: 30 mg via INTRAVENOUS
  Filled 2012-06-20: qty 1

## 2012-06-20 MED ORDER — FENTANYL CITRATE 0.05 MG/ML IJ SOLN
50.0000 ug | INTRAMUSCULAR | Status: DC | PRN
  Administered 2012-06-20: 50 ug via INTRAVENOUS
  Filled 2012-06-20: qty 2

## 2012-06-20 MED ORDER — ONDANSETRON HCL 4 MG/2ML IJ SOLN
4.0000 mg | INTRAMUSCULAR | Status: DC | PRN
  Filled 2012-06-20: qty 2

## 2012-06-20 MED ORDER — ONDANSETRON 4 MG PO TBDP: 4.0000 mg | ORAL_TABLET | Freq: Three times a day (TID) | ORAL | Status: DC | PRN

## 2012-06-20 NOTE — ED Notes (Signed)
Pt presents via EMS with lower abd pain and n/v/d. Saattes abd pain for 4 days and onset of n/v/d an hour ago, emesis noted on admission.

## 2012-06-20 NOTE — ED Provider Notes (Signed)
History     CSN: [id number]  Arrival date & time 06/20/12  2014   First MD Initiated Contact with Patient 06/20/12 2016      Chief Complaint  Patient presents with  . Emesis  . Abdominal Pain  . Flank Pain     HPI Pt was seen at 2025.  Per pt, c/o sudden onset and persistence of constant left sided abd "pain" that began approx 1 hour PTA.  Pt states the pain radiates into her left left flank area and has been associated with several episodes of N/V.  Describes the pain as "it feels like my last kidney stone."  Denies fevers, no dysuria/hematuria, no black or blood in stools or emesis, no diarrhea, no vaginal bleeding/discharge, no CP/SOB, no rash.     Past Medical History  Diagnosis Date  . Kidney stones   . Chronic back pain   . Normal cardiac stress test 2010  . Hypertension     Past Surgical History  Procedure Date  . Cholecystectomy   . Abdominal hysterectomy   . Back surgery   . Cardiac catheterization 11/2009    normal coronary arteries [facility])    History  Substance Use Topics  . Smoking status: Never Smoker   . Smokeless tobacco: Not on file  . Alcohol Use: No      Review of Systems ROS: Statement: All systems negative except as marked or noted in the HPI; Constitutional: Negative for fever and chills. ; ; Eyes: Negative for eye pain, redness and discharge. ; ; ENMT: Negative for ear pain, hoarseness, nasal congestion, sinus pressure and sore throat. ; ; Cardiovascular: Negative for chest pain, palpitations, diaphoresis, dyspnea and peripheral edema. ; ; Respiratory: Negative for cough, wheezing and stridor. ; ; Gastrointestinal: +N/V, abd pain. Negative for diarrhea, blood in stool, hematemesis, jaundice and rectal bleeding. . ; ; Genitourinary: +flank pain. Negative for dysuria and hematuria. ; ; GYN:  No vaginal bleeding, no vaginal discharge, no vulvar pain. ;; Musculoskeletal: Negative for back pain and neck pain. Negative for swelling and trauma.; ; Skin:  Negative for pruritus, rash, abrasions, blisters, bruising and skin lesion.; ; Neuro: Negative for headache, lightheadedness and neck stiffness. Negative for weakness, altered level of consciousness , altered mental status, extremity weakness, paresthesias, involuntary movement, seizure and syncope.       Allergies  Dilaudid and Morphine and related  Home Medications  No current outpatient prescriptions on file.  BP 172/78  Pulse 88  Temp 97.9 F (36.6 C) (Oral)  Resp 20  SpO2 100%  Physical Exam 2030: Physical examination:  Nursing notes reviewed; Vital signs and O2 SAT reviewed;  Constitutional: Well developed, Well nourished, Well hydrated, Uncomfortable appearing; Head:  Normocephalic, atraumatic; Eyes: EOMI, PERRL, No scleral icterus; ENMT: Mouth and pharynx normal, Mucous membranes moist; Neck: Supple, Full range of motion, No lymphadenopathy; Cardiovascular: Regular rate and rhythm, No murmur, rub, or gallop; Respiratory: Breath sounds clear & equal bilaterally, No rales, rhonchi, wheezes.  Speaking full sentences with ease, Normal respiratory effort/excursion; Chest: Nontender, Movement normal; Abdomen: Soft, +LUQ and LLQ tender to palp. No rebound or guarding. Nondistended, Normal bowel sounds; Genitourinary: No CVA tenderness; Spine:  No midline CS, TS, LS tenderness.;; Extremities: Pulses normal, No tenderness, No edema, No calf edema or asymmetry.; Neuro: AA&Ox3, Major CN grossly intact.  Speech clear. No gross focal motor or sensory deficits in extremities.; Skin: Color normal, Warm, Dry.   ED Course  Procedures    MDM  MDM Reviewed:  previous chart, nursing note and vitals Interpretation: labs and CT scan     Results for orders placed during the hospital encounter of 06/20/12  PREGNANCY, URINE      Component Value Range   Preg Test, Ur NEGATIVE  NEGATIVE  URINALYSIS, ROUTINE W REFLEX MICROSCOPIC      Component Value Range   Color, Urine YELLOW  YELLOW    APPearance CLEAR  CLEAR   Specific Gravity, Urine 1.010  1.005 - 1.030   pH 6.5  5.0 - 8.0   Glucose, UA NEGATIVE  NEGATIVE mg/dL   Hgb urine dipstick NEGATIVE  NEGATIVE   Bilirubin Urine NEGATIVE  NEGATIVE   Ketones, ur NEGATIVE  NEGATIVE mg/dL   Protein, ur NEGATIVE  NEGATIVE mg/dL   Urobilinogen, UA 0.2  0.0 - 1.0 mg/dL   Nitrite NEGATIVE  NEGATIVE   Leukocytes, UA NEGATIVE  NEGATIVE   Ct Abdomen Pelvis Wo Contrast 06/20/2012  *RADIOLOGY REPORT*  Clinical Data: Left lower quadrant abdominal pain and flank pain; vomiting.  History of renal stones.  CT ABDOMEN AND PELVIS WITHOUT CONTRAST  Technique:  Multidetector CT imaging of the abdomen and pelvis was performed following the standard protocol without intravenous contrast.  Comparison: MRI of the lumbar spine performed 04/05/2007, and abdominal ultrasound performed 04/02/2004  Findings: The visualized lung bases are clear.  The liver and spleen are unremarkable in appearance.  The patient is status post cholecystectomy, with clips noted along the gallbladder fossa.  The pancreas and adrenal glands are unremarkable.  There is a 3 mm obstructing stone noted distally at the left vesicoureteral junction, along the base of the bladder. Minimal associated left-sided hydronephrosis is noted, with mild left-sided perinephric stranding.  The right kidney is grossly unremarkable in appearance.  No nonobstructing renal stones are identified.  No free fluid is identified.  The small bowel is unremarkable in appearance.  The stomach is within normal limits.  No acute vascular abnormalities are seen.  The appendix is normal in caliber and contains air, without evidence for appendicitis.  The transverse colon is redundant; the colon is grossly unremarkable in appearance.  The bladder is mildly distended and grossly unremarkable.  The patient is status post hysterectomy; the ovaries appear grossly symmetric.  No suspicious adnexal masses are seen.  No inguinal  lymphadenopathy is seen.  No acute osseous abnormalities are identified.  The patient is status post posterior lumbar spinal fusion at L5-S1, with associated spacer.  Mild degenerative change is noted at this level.  IMPRESSION: 3 mm obstructing stone noted distally at the left vesicoureteral junction, with minimal associated left-sided hydronephrosis.   Original Report Authenticated By: Tonia Ghent, M.D.      2145:  Pt feels improved after meds and wants to go home now.  Has tol PO well without N/V.  No stooling while in the ED.  Dx and testing d/w pt and family.  Questions answered.  Verb understanding, agreeable to d/c home with outpt f/u.       Laray Anger, DO 06/24/12 [phone]

## 2012-06-27 MED FILL — Oxycodone w/ Acetaminophen Tab 5-325 MG: ORAL | Qty: 6 | Status: AC

## 2012-10-26 ENCOUNTER — Other Ambulatory Visit: Payer: Self-pay | Admitting: Physician Assistant

## 2012-10-26 DIAGNOSIS — D229 Melanocytic nevi, unspecified: Secondary | ICD-10-CM

## 2012-10-26 HISTORY — DX: Melanocytic nevi, unspecified: D22.9

## 2013-09-04 ENCOUNTER — Other Ambulatory Visit (HOSPITAL_COMMUNITY): Payer: Self-pay | Admitting: Family Medicine

## 2013-09-04 DIAGNOSIS — E042 Nontoxic multinodular goiter: Secondary | ICD-10-CM

## 2013-09-07 ENCOUNTER — Ambulatory Visit (HOSPITAL_COMMUNITY)
Admission: RE | Admit: 2013-09-07 | Discharge: 2013-09-07 | Disposition: A | Discharge status: Home / Self Care | Payer: BC Managed Care – PPO | Source: Ambulatory Visit | Attending: Family Medicine | Admitting: Family Medicine

## 2013-09-07 DIAGNOSIS — E042 Nontoxic multinodular goiter: Secondary | ICD-10-CM

## 2013-10-30 ENCOUNTER — Other Ambulatory Visit (HOSPITAL_COMMUNITY): Payer: Self-pay | Admitting: Endocrinology

## 2013-10-30 DIAGNOSIS — E0590 Thyrotoxicosis, unspecified without thyrotoxic crisis or storm: Secondary | ICD-10-CM

## 2013-10-31 ENCOUNTER — Ambulatory Visit (HOSPITAL_COMMUNITY): Payer: BC Managed Care – PPO

## 2013-11-01 ENCOUNTER — Other Ambulatory Visit (HOSPITAL_COMMUNITY): Payer: BC Managed Care – PPO

## 2013-11-02 ENCOUNTER — Ambulatory Visit (HOSPITAL_COMMUNITY): Payer: BC Managed Care – PPO

## 2013-11-05 ENCOUNTER — Encounter (HOSPITAL_COMMUNITY): Payer: Self-pay

## 2013-11-05 ENCOUNTER — Encounter (HOSPITAL_COMMUNITY)
Admission: RE | Admit: 2013-11-05 | Discharge: 2013-11-05 | Disposition: A | Discharge status: Home / Self Care | Payer: BC Managed Care – PPO | Source: Ambulatory Visit | Attending: Endocrinology | Admitting: Endocrinology

## 2013-11-05 DIAGNOSIS — E0590 Thyrotoxicosis, unspecified without thyrotoxic crisis or storm: Secondary | ICD-10-CM | POA: Insufficient documentation

## 2013-11-05 MED ORDER — SODIUM IODIDE I 131 CAPSULE
16.0000 | Freq: Once | INTRAVENOUS | Status: AC | PRN
  Administered 2013-11-05: 16 via ORAL

## 2013-11-06 ENCOUNTER — Encounter (HOSPITAL_COMMUNITY)
Admission: RE | Admit: 2013-11-06 | Discharge: 2013-11-06 | Disposition: A | Discharge status: Home / Self Care | Payer: BC Managed Care – PPO | Source: Ambulatory Visit | Attending: Endocrinology | Admitting: Endocrinology

## 2013-11-06 ENCOUNTER — Encounter (HOSPITAL_COMMUNITY): Payer: Self-pay

## 2013-11-06 HISTORY — DX: Unspecified asthma, uncomplicated: J45.909

## 2013-11-06 MED ORDER — SODIUM PERTECHNETATE TC 99M INJECTION
10.0000 | Freq: Once | INTRAVENOUS | Status: AC | PRN
  Administered 2013-11-06: 10 via INTRAVENOUS

## 2013-11-09 ENCOUNTER — Encounter (HOSPITAL_COMMUNITY)
Admission: RE | Admit: 2013-11-09 | Discharge: 2013-11-09 | Disposition: A | Discharge status: Home / Self Care | Payer: BC Managed Care – PPO | Source: Ambulatory Visit | Attending: Endocrinology | Admitting: Endocrinology

## 2013-11-09 ENCOUNTER — Encounter (HOSPITAL_COMMUNITY): Payer: Self-pay

## 2013-11-09 DIAGNOSIS — E0590 Thyrotoxicosis, unspecified without thyrotoxic crisis or storm: Secondary | ICD-10-CM | POA: Insufficient documentation

## 2013-11-09 MED ORDER — SODIUM IODIDE I 131 CAPSULE
30.0000 | Freq: Once | INTRAVENOUS | Status: AC | PRN
  Administered 2013-11-09: 30 via ORAL

## 2014-05-22 ENCOUNTER — Telehealth: Payer: Self-pay

## 2014-05-22 NOTE — Telephone Encounter (Signed)
Pt called and wants to set up her past due colonoscopy. She also said that her husband needs one as well per PCP. They would like for both of them to be scheduled the same day and have made arrangements for their daughter to drive them to and from hospital.  Please call [phone]. See separate phone note regarding husband Elenore Rota)

## 2014-05-23 NOTE — Telephone Encounter (Signed)
Pt is not sure when her last colonoscopy was. I have called [facility] and Wannetta Sender will fax over the last one.

## 2014-06-06 ENCOUNTER — Other Ambulatory Visit: Payer: Self-pay

## 2014-06-06 DIAGNOSIS — Z1211 Encounter for screening for malignant neoplasm of colon: Secondary | ICD-10-CM

## 2014-06-06 NOTE — Telephone Encounter (Signed)
Husband is scheduled following wife's procedure on same day.  I'm sending Dr. Gala Romney a message. Their daughter will bring them to the hospital and stay with them.

## 2014-06-06 NOTE — Telephone Encounter (Addendum)
Gastroenterology Pre-Procedure Review  Request Date: 06/05/2014 Requesting Physician: Dr. Hilma Favors PATIENT REVIEW QUESTIONS: The patient responded to the following health history questions as indicated:    Pt's last colonoscopy was 08/16/2006 by Dr. Gala Romney and her next was recommended at age 53 She has family hx of colon cancer in grandparent on maternal and paternal sides of family Several cousins have colon cancer  Triaged by [facility]  1. Diabetes Melitis: no 2. Joint replacements in the past 12 months: no 3. Major health problems in the past 3 months   Thyroid radiation/ Dr. Suzette Battiest 4. Has an artificial valve or MVP: no 5. Has a defibrillator: no 6. Has been advised in past to take antibiotics in advance of a procedure like teeth cleaning: no    MEDICATIONS & ALLERGIES:    Patient reports the following regarding taking any blood thinners:   Plavix? no Aspirin? no Coumadin? no  Patient confirms/reports the following medications:  Current Outpatient Prescriptions  Medication Sig Dispense Refill  . levothyroxine (SYNTHROID, LEVOTHROID) 50 MCG tablet Take 50 mcg by mouth daily before breakfast.    . naproxen sodium (ANAPROX) 220 MG tablet Take 220 mg by mouth 2 (two) times daily with a meal. As needed    . olmesartan (BENICAR) 40 MG tablet Take 40 mg by mouth daily.    Marland Kitchen triamcinolone (NASACORT ALLERGY 24HR) 55 MCG/ACT AERO nasal inhaler Place 2 sprays into the nose daily. Every other day as needed     No current facility-administered medications for this visit.    Patient confirms/reports the following allergies:  Allergies  Allergen Reactions  . Phenergan [Promethazine Hcl] Nausea And Vomiting  . Dilaudid [Hydromorphone Hcl] Nausea And Vomiting    Nausea   . Morphine And Related Nausea And Vomiting    nausea     No orders of the defined types were placed in this encounter.    AUTHORIZATION INFORMATION Primary Insurance:   ID #: Group #:  Pre-Cert / Auth required:   Pre-Cert / Auth #:   Secondary Insurance:   ID #:  Group #:  Pre-Cert / Auth required:  Pre-Cert / Auth #:   SCHEDULE INFORMATION: Procedure has been scheduled as follows:  Date:  07/15/2014                 Time:  7:30 AM Location: [facility] Short Stay  This Gastroenterology Pre-Precedure Review Form is being routed to the following provider(s): R. Garfield Cornea, MD

## 2014-06-06 NOTE — Addendum Note (Signed)
Addended by: Everardo All on: 06/06/2014 01:07 PM   Modules accepted: Medications

## 2014-06-07 NOTE — Addendum Note (Signed)
Addended by: Mahala Menghini on: 06/07/2014 12:50 PM   Modules accepted: Orders, Medications

## 2014-06-07 NOTE — Telephone Encounter (Signed)
OK to schedule

## 2014-06-10 MED ORDER — PEG-KCL-NACL-NASULF-NA ASC-C 100 G PO SOLR: 1.0000 | ORAL | Status: DC

## 2014-06-10 NOTE — Telephone Encounter (Signed)
Rx sent to the pharmacy and instructions mailed to pt.  

## 2014-06-10 NOTE — Addendum Note (Signed)
Addended by: Everardo All on: 06/10/2014 10:35 AM   Modules accepted: Orders

## 2014-06-18 ENCOUNTER — Other Ambulatory Visit: Payer: Self-pay | Admitting: Adult Health

## 2014-06-24 ENCOUNTER — Telehealth: Payer: Self-pay

## 2014-06-24 NOTE — Telephone Encounter (Signed)
I called [employer] at [phone] and spoke to [place] who said that a PA is not required for screening colonoscopy.

## 2014-06-28 ENCOUNTER — Telehealth: Payer: Self-pay

## 2014-06-28 NOTE — Telephone Encounter (Signed)
I called pt and LMOM for a return call to update meds prior to procedure on 07/15/2014.

## 2014-07-08 ENCOUNTER — Encounter: Payer: Self-pay | Admitting: Adult Health

## 2014-07-08 ENCOUNTER — Other Ambulatory Visit (HOSPITAL_COMMUNITY)
Admission: RE | Admit: 2014-07-08 | Discharge: 2014-07-08 | Disposition: A | Discharge status: Home / Self Care | Payer: BC Managed Care – PPO | Source: Ambulatory Visit | Attending: Adult Health | Admitting: Adult Health

## 2014-07-08 ENCOUNTER — Ambulatory Visit (INDEPENDENT_AMBULATORY_CARE_PROVIDER_SITE_OTHER): Payer: BC Managed Care – PPO | Admitting: Adult Health

## 2014-07-08 VITALS — BP 102/60 | HR 76 | Ht 65.0 in | Wt 137.5 lb

## 2014-07-08 DIAGNOSIS — Z01419 Encounter for gynecological examination (general) (routine) without abnormal findings: Secondary | ICD-10-CM | POA: Insufficient documentation

## 2014-07-08 DIAGNOSIS — Z1212 Encounter for screening for malignant neoplasm of rectum: Secondary | ICD-10-CM

## 2014-07-08 DIAGNOSIS — Z1151 Encounter for screening for human papillomavirus (HPV): Secondary | ICD-10-CM | POA: Diagnosis present

## 2014-07-08 LAB — HEMOCCULT GUIAC POC 1CARD (OFFICE): FECAL OCCULT BLD: NEGATIVE

## 2014-07-08 NOTE — Patient Instructions (Signed)
Physical in 1 year Mammogram yearly Labs with PCP 

## 2014-07-08 NOTE — Progress Notes (Signed)
Patient ID: [name], female   DOB: September 10, 1960, 53 y.o.   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  Current Medications, Allergies, Past Medical History, Past Surgical History, Family History and Social History were reviewed in [employer] record.     Review of Systems: Patient denies any headaches, blurred vision, shortness of breath, chest pain, abdominal pain, problems with bowel movements, urination, or intercourse. No joint pain or mood swings.    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  The vagina is normal in appearance. The cervix and uterus are absent.Pap performed with HPV.  No  adnexal masses or tenderness noted. Rectal: Good sphincter tone, no polyps, or hemorrhoids felt.  Hemoccult negative.Has colonoscopy scheduled 12/21 with Dr Gala Romney Extremities:  No swelling or varicosities noted Psych:  No mood changes,alert and cooperative,seems happy, she is self employed at [employer]: Well woman gyn exam with pap    Plan: Physical in 1 year Mammogram yearly  Labs with PCP Colonoscopy 07/15/14. Encouraged to get flu shot

## 2014-07-09 LAB — CYTOLOGY - PAP

## 2014-07-15 ENCOUNTER — Encounter (HOSPITAL_COMMUNITY)
Admission: RE | Disposition: A | Discharge status: Home / Self Care | Payer: Self-pay | Source: Ambulatory Visit | Attending: Internal Medicine

## 2014-07-15 ENCOUNTER — Ambulatory Visit (HOSPITAL_COMMUNITY)
Admission: RE | Admit: 2014-07-15 | Discharge: 2014-07-15 | Disposition: A | Discharge status: Home / Self Care | Payer: BC Managed Care – PPO | Source: Ambulatory Visit | Attending: Internal Medicine | Admitting: Internal Medicine

## 2014-07-15 ENCOUNTER — Encounter (HOSPITAL_COMMUNITY): Payer: Self-pay

## 2014-07-15 DIAGNOSIS — Z800 Family history of malignant neoplasm of digestive organs: Secondary | ICD-10-CM | POA: Diagnosis not present

## 2014-07-15 DIAGNOSIS — Z1211 Encounter for screening for malignant neoplasm of colon: Secondary | ICD-10-CM | POA: Diagnosis not present

## 2014-07-15 DIAGNOSIS — K649 Unspecified hemorrhoids: Secondary | ICD-10-CM | POA: Diagnosis not present

## 2014-07-15 DIAGNOSIS — I10 Essential (primary) hypertension: Secondary | ICD-10-CM | POA: Insufficient documentation

## 2014-07-15 DIAGNOSIS — K6289 Other specified diseases of anus and rectum: Secondary | ICD-10-CM | POA: Diagnosis not present

## 2014-07-15 HISTORY — DX: Hypothyroidism, unspecified: E03.9

## 2014-07-15 HISTORY — DX: Nonrheumatic mitral (valve) prolapse: I34.1

## 2014-07-15 HISTORY — PX: COLONOSCOPY: SHX5424

## 2014-07-15 HISTORY — DX: Personal history of urinary calculi: Z87.442

## 2014-07-15 SURGERY — COLONOSCOPY
Anesthesia: Moderate Sedation

## 2014-07-15 MED ORDER — MEPERIDINE HCL 100 MG/ML IJ SOLN
INTRAMUSCULAR | Status: DC
Start: 2014-07-15 — End: 2014-07-15
  Filled 2014-07-15: qty 2

## 2014-07-15 MED ORDER — SIMETHICONE 40 MG/0.6ML PO SUSP
ORAL | Status: AC
  Filled 2014-07-15: qty 0.6

## 2014-07-15 MED ORDER — MEPERIDINE HCL 100 MG/ML IJ SOLN
INTRAMUSCULAR | Status: DC | PRN
  Administered 2014-07-15: 25 mg via INTRAVENOUS
  Administered 2014-07-15: 50 mg via INTRAVENOUS
  Administered 2014-07-15: 25 mg via INTRAVENOUS

## 2014-07-15 MED ORDER — MIDAZOLAM HCL 5 MG/5ML IJ SOLN
INTRAMUSCULAR | Status: AC
  Filled 2014-07-15: qty 10

## 2014-07-15 MED ORDER — SODIUM CHLORIDE 0.9 % IV SOLN
INTRAVENOUS | Status: DC
  Administered 2014-07-15: 07:00:00 via INTRAVENOUS

## 2014-07-15 MED ORDER — STERILE WATER FOR IRRIGATION IR SOLN
Status: DC | PRN
  Administered 2014-07-15: 08:00:00

## 2014-07-15 MED ORDER — ONDANSETRON HCL 4 MG/2ML IJ SOLN
INTRAMUSCULAR | Status: AC
  Filled 2014-07-15: qty 2

## 2014-07-15 MED ORDER — MIDAZOLAM HCL 5 MG/5ML IJ SOLN
INTRAMUSCULAR | Status: DC | PRN
  Administered 2014-07-15: 2 mg via INTRAVENOUS
  Administered 2014-07-15: 1 mg via INTRAVENOUS
  Administered 2014-07-15: 2 mg via INTRAVENOUS

## 2014-07-15 MED ORDER — ONDANSETRON HCL 4 MG/2ML IJ SOLN
INTRAMUSCULAR | Status: DC | PRN
  Administered 2014-07-15: 4 mg via INTRAVENOUS

## 2014-07-15 NOTE — Discharge Instructions (Signed)
°  Colonoscopy Discharge Instructions  Read the instructions outlined below and refer to this sheet in the next few weeks. These discharge instructions provide you with general information on caring for yourself after you leave the hospital. Your doctor may also give you specific instructions. While your treatment has been planned according to the most current medical practices available, unavoidable complications occasionally occur. If you have any problems or questions after discharge, call Dr. Gala Romney at [phone]. ACTIVITY  You may resume your regular activity, but move at a slower pace for the next 24 hours.   Take frequent rest periods for the next 24 hours.   Walking will help get rid of the air and reduce the bloated feeling in your belly (abdomen).   No driving for 24 hours (because of the medicine (anesthesia) used during the test).    Do not sign any important legal documents or operate any machinery for 24 hours (because of the anesthesia used during the test).  NUTRITION  Drink plenty of fluids.   You may resume your normal diet as instructed by your doctor.   Begin with a light meal and progress to your normal diet. Heavy or fried foods are harder to digest and may make you feel sick to your stomach (nauseated).   Avoid alcoholic beverages for 24 hours or as instructed.  MEDICATIONS  You may resume your normal medications unless your doctor tells you otherwise.  WHAT YOU CAN EXPECT TODAY  Some feelings of bloating in the abdomen.   Passage of more gas than usual.   Spotting of blood in your stool or on the toilet paper.  IF YOU HAD POLYPS REMOVED DURING THE COLONOSCOPY:  No aspirin products for 7 days or as instructed.   No alcohol for 7 days or as instructed.   Eat a soft diet for the next 24 hours.  FINDING OUT THE RESULTS OF YOUR TEST Not all test results are available during your visit. If your test results are not back during the visit, make an appointment  with your caregiver to find out the results. Do not assume everything is normal if you have not heard from your caregiver or the medical facility. It is important for you to follow up on all of your test results.  SEEK IMMEDIATE MEDICAL ATTENTION IF:  You have more than a spotting of blood in your stool.   Your belly is swollen (abdominal distention).   You are nauseated or vomiting.   You have a temperature over 101.   You have abdominal pain or discomfort that is severe or gets worse throughout the day.   Family should consider genetic testing for colon cancer  Recommend repeat screening colonoscopy in 5 years

## 2014-07-15 NOTE — H&P (Signed)
@LOGO @   Primary Care Physician:  Purvis Kilts, MD Primary Gastroenterologist:  Dr. Gala Romney  Pre-Procedure History & Physical: HPI:  [name] is a 53 y.o. female is here for a screening colonoscopy. Negative colonoscopy 2008. No GI symptoms. Family history colon cancer in multiple second and third-degree relatives but no first-degree relatives.  Past Medical History  Diagnosis Date  . Chronic back pain   . Normal cardiac stress test 2010  . Hypertension   . Asthma   . Thyroid disease   . PONV (postoperative nausea and vomiting)   . Hypothyroidism   . History of kidney stones   . Mitral valve prolapse     Past Surgical History  Procedure Laterality Date  . Cholecystectomy    . Abdominal hysterectomy    . Cardiac catheterization  11/2009    normal coronary arteries ([facility])  . Back surgery      x3-ruptured disc  . Knee arthroscopy Left   . Cyst removal hand Right   . Bone spur Right     foot    Prior to Admission medications   Medication Sig Start Date End Date Taking? Authorizing Provider  levothyroxine (SYNTHROID, LEVOTHROID) 50 MCG tablet Take 50 mcg by mouth. Takes 1 tab Monday-Thursday then take 2 tabs on Friday-Sunday.   Yes Historical Provider, MD  olmesartan (BENICAR) 40 MG tablet Take 40 mg by mouth. Takes half a tab daily.   Yes Historical Provider, MD  triamcinolone (NASACORT ALLERGY 24HR) 55 MCG/ACT AERO nasal inhaler Place 2 sprays into the nose as needed. Every other day as needed   Yes Historical Provider, MD  naproxen sodium (ANAPROX) 220 MG tablet Take 220 mg by mouth as needed.    Historical Provider, MD    Allergies as of 06/06/2014 - Review Complete 06/06/2014  Allergen Reaction Noted  . Phenergan [promethazine hcl] Nausea And Vomiting 06/06/2014  . Dilaudid [hydromorphone hcl] Nausea And Vomiting 06/20/2012  . Morphine and related Nausea And Vomiting 06/20/2012    Family History  Problem Relation Age of Onset  . COPD Mother   .  Cancer Mother     skin  . Hernia Mother   . Fibromyalgia Mother   . Hypertension Mother   . Other Father     open heart surgery  . Hypertension Father   . Hypertension Brother   . Interstitial cystitis Daughter   . Cancer Maternal Grandmother     cervical  . Heart attack Maternal Grandmother     died at age 83  . Cancer Maternal Grandfather     colon  . Cancer Paternal Grandmother     colon,cervical,melonoma  . Other Paternal Grandmother     open heart surgery  . Arthritis Paternal Grandfather   . Leukemia Paternal Grandfather     History   Social History  . Marital Status: Married    Spouse Name: N/A    Number of Children: N/A  . Years of Education: N/A   Occupational History  . Not on file.   Social History Main Topics  . Smoking status: Never Smoker   . Smokeless tobacco: Never Used  . Alcohol Use: No  . Drug Use: No  . Sexual Activity: Yes    Birth Control/ Protection: Surgical   Other Topics Concern  . Not on file   Social History Narrative    Review of Systems: See HPI, otherwise negative ROS  Physical Exam: BP 152/84 mmHg  Pulse 63  Temp(Src) 97.7 F (36.5  C) (Oral)  Resp 16  Ht 5\' 5"  (1.651 m)  Wt 137 lb (62.143 kg)  BMI 22.80 kg/m2  SpO2 100% General:   Alert,  Well-developed, well-nourished, pleasant and cooperative in NAD Head:  Normocephalic and atraumatic. Eyes:  Sclera clear, no icterus.   Conjunctiva pink. Ears:  Normal auditory acuity. Nose:  No deformity, discharge,  or lesions. Mouth:  No deformity or lesions, dentition normal. Neck:  Supple; no masses or thyromegaly. Lungs:  Clear throughout to auscultation.   No wheezes, crackles, or rhonchi. No acute distress. Heart:  Regular rate and rhythm; no murmurs, clicks, rubs,  or gallops. Abdomen:  Soft, nontender and nondistended. No masses, hepatosplenomegaly or hernias noted. Normal bowel sounds, without guarding, and without rebound.   Msk:  Symmetrical without gross  deformities. Normal posture. Pulses:  Normal pulses noted. Extremities:  Without clubbing or edema.  Impression: [name] is now here to undergo a screening colonoscopy. Risks, benefits, limitations, imponderables and alternatives regarding colonoscopy have been reviewed with the patient. Questions have been answered. All parties agreeable.     Notice:  This dictation was prepared with Dragon dictation along with smaller phrase technology. Any transcriptional errors that result from this process are unintentional and may not be corrected upon review.

## 2014-07-15 NOTE — Op Note (Signed)
[facility] [address] [place], [address]   COLONOSCOPY PROCEDURE REPORT  PATIENT: [name], [name]  MR#: [id number] BIRTHDATE: 1961-05-13 , 41  yrs. old GENDER: female ENDOSCOPIST: R.  Garfield Cornea, MD FACP [facility] REFERRED BE:EFEO Hilma Favors, M.D.  Mallory Shirk, M.D. PROCEDURE DATE:  08/07/2014 PROCEDURE:   Colonoscopy, screening INDICATIONS:Screening examination.  Positive family history of colon cancer in multiple second and third-degree relatives but no first-degree. MEDICATIONS: Versed 5 mg IV and Demerol 100 mg IV in divided doses.  ASA CLASS:       Class II  CONSENT: The risks, benefits, alternatives and imponderables including but not limited to bleeding, perforation as well as the possibility of a missed lesion have been reviewed.  The potential for biopsy, lesion removal, etc. have also been discussed. Questions have been answered.  All parties agreeable.  Please see the history and physical in the medical record for more information.  DESCRIPTION OF PROCEDURE:   After the risks benefits and alternatives of the procedure were thoroughly explained, informed consent was obtained.  The digital rectal exam      The EC-[id number] ([id number])  endoscope was introduced through the anus and advanced to the cecum, which was identified by both the appendix and ileocecal valve. No adverse events experienced.   The quality of the prep was adequate.  The instrument was then slowly withdrawn as the colon was fully examined.      COLON FINDINGS: Clinical hemorrhoids and single anal papilla; otherwise normal rectum.  Rectal vault small.  Unable to retroflex. For the same reason, the rectal mucosa was seen well on?"face. Retroflexion was not performed. .  The colonic mucosa appeared entirely normal.  Withdrawal time=9 minutes 0 seconds.  The scope was withdrawn and the procedure completed. COMPLICATIONS: There were no immediate complications.  ENDOSCOPIC  IMPRESSION: Anal canal hemorrhoids and anal papilla; otherwise, normal colonoscopy  RECOMMENDATIONS: Family should consider genetic testing. No first-degree relative with colon cancer. With information we know, I would recommend the patient return in 5 years for another screening colonoscopy.  eSigned:  R. Garfield Cornea, MD Rosalita Chessman [facility] 07-Aug-2014 8:16 AM   cc:  CPT CODES: ICD CODES:  The ICD and CPT codes recommended by this software are interpretations from the data that the clinical staff has captured with the software.  The verification of the translation of this report to the ICD and CPT codes and modifiers is the sole responsibility of the health care institution and practicing physician where this report was generated.  [employer] will not be held responsible for the validity of the ICD and CPT codes included on this report.  AMA assumes no liability for data contained or not contained herein. CPT is a Designer, television/film set of the [employer].

## 2014-07-16 ENCOUNTER — Encounter (HOSPITAL_COMMUNITY): Payer: Self-pay | Admitting: Internal Medicine

## 2015-05-03 ENCOUNTER — Emergency Department (HOSPITAL_COMMUNITY): Payer: BLUE CROSS/BLUE SHIELD

## 2015-05-03 ENCOUNTER — Encounter (HOSPITAL_COMMUNITY): Payer: Self-pay | Admitting: *Deleted

## 2015-05-03 ENCOUNTER — Emergency Department (HOSPITAL_COMMUNITY)
Admission: EM | Admit: 2015-05-03 | Discharge: 2015-05-03 | Disposition: A | Discharge status: Home / Self Care | Payer: BLUE CROSS/BLUE SHIELD | Attending: Emergency Medicine | Admitting: Emergency Medicine

## 2015-05-03 DIAGNOSIS — Z79899 Other long term (current) drug therapy: Secondary | ICD-10-CM | POA: Diagnosis not present

## 2015-05-03 DIAGNOSIS — R110 Nausea: Secondary | ICD-10-CM | POA: Insufficient documentation

## 2015-05-03 DIAGNOSIS — Z87442 Personal history of urinary calculi: Secondary | ICD-10-CM | POA: Diagnosis not present

## 2015-05-03 DIAGNOSIS — J45909 Unspecified asthma, uncomplicated: Secondary | ICD-10-CM | POA: Diagnosis not present

## 2015-05-03 DIAGNOSIS — Z9889 Other specified postprocedural states: Secondary | ICD-10-CM | POA: Insufficient documentation

## 2015-05-03 DIAGNOSIS — I10 Essential (primary) hypertension: Secondary | ICD-10-CM | POA: Diagnosis not present

## 2015-05-03 DIAGNOSIS — R61 Generalized hyperhidrosis: Secondary | ICD-10-CM | POA: Insufficient documentation

## 2015-05-03 DIAGNOSIS — G8929 Other chronic pain: Secondary | ICD-10-CM | POA: Diagnosis not present

## 2015-05-03 DIAGNOSIS — R079 Chest pain, unspecified: Secondary | ICD-10-CM | POA: Diagnosis present

## 2015-05-03 DIAGNOSIS — E039 Hypothyroidism, unspecified: Secondary | ICD-10-CM | POA: Insufficient documentation

## 2015-05-03 DIAGNOSIS — R200 Anesthesia of skin: Secondary | ICD-10-CM | POA: Insufficient documentation

## 2015-05-03 DIAGNOSIS — R0789 Other chest pain: Secondary | ICD-10-CM

## 2015-05-03 LAB — COMPREHENSIVE METABOLIC PANEL
ALK PHOS: 50 U/L (ref 38–126)
ALT: 30 U/L (ref 14–54)
AST: 51 U/L — ABNORMAL HIGH (ref 15–41)
Albumin: 4 g/dL (ref 3.5–5.0)
Anion gap: 5 (ref 5–15)
BILIRUBIN TOTAL: 0.9 mg/dL (ref 0.3–1.2)
BUN: 24 mg/dL — ABNORMAL HIGH (ref 6–20)
CALCIUM: 9.6 mg/dL (ref 8.9–10.3)
CO2: 27 mmol/L (ref 22–32)
Chloride: 108 mmol/L (ref 101–111)
Creatinine, Ser: 0.72 mg/dL (ref 0.44–1.00)
Glucose, Bld: 106 mg/dL — ABNORMAL HIGH (ref 65–99)
POTASSIUM: 3.7 mmol/L (ref 3.5–5.1)
Sodium: 140 mmol/L (ref 135–145)
Total Protein: 6.4 g/dL — ABNORMAL LOW (ref 6.5–8.1)

## 2015-05-03 LAB — CBC WITH DIFFERENTIAL/PLATELET
BASOS ABS: 0 10*3/uL (ref 0.0–0.1)
Basophils Relative: 0 %
Eosinophils Absolute: 0 10*3/uL (ref 0.0–0.7)
Eosinophils Relative: 1 %
HEMATOCRIT: 36.6 % (ref 36.0–46.0)
HEMOGLOBIN: 12.4 g/dL (ref 12.0–15.0)
Lymphocytes Relative: 15 %
Lymphs Abs: 0.9 10*3/uL (ref 0.7–4.0)
MCH: 31.2 pg (ref 26.0–34.0)
MCHC: 33.9 g/dL (ref 30.0–36.0)
MCV: 92.2 fL (ref 78.0–100.0)
Monocytes Absolute: 0.6 10*3/uL (ref 0.1–1.0)
Monocytes Relative: 9 %
NEUTROS ABS: 4.5 10*3/uL (ref 1.7–7.7)
NEUTROS PCT: 75 %
Platelets: 149 10*3/uL — ABNORMAL LOW (ref 150–400)
RBC: 3.97 MIL/uL (ref 3.87–5.11)
RDW: 11.2 % — AB (ref 11.5–15.5)
WBC: 6 10*3/uL (ref 4.0–10.5)

## 2015-05-03 LAB — TROPONIN I

## 2015-05-03 LAB — D-DIMER, QUANTITATIVE (NOT AT ARMC): D-Dimer, Quant: 0.31 ug/mL-FEU (ref 0.00–0.48)

## 2015-05-03 MED ORDER — NAPROXEN 500 MG PO TABS: ORAL_TABLET | ORAL | Status: DC

## 2015-05-03 MED ORDER — KETOROLAC TROMETHAMINE 30 MG/ML IJ SOLN
30.0000 mg | Freq: Once | INTRAMUSCULAR | Status: AC
  Administered 2015-05-03: 30 mg via INTRAVENOUS
  Filled 2015-05-03: qty 1

## 2015-05-03 MED ORDER — METHOCARBAMOL 500 MG PO TABS: ORAL_TABLET | ORAL | Status: DC

## 2015-05-03 NOTE — ED Provider Notes (Signed)
CSN: [id number]     Arrival date & time 05/03/15  4174 History   First MD Initiated Contact with Patient 05/03/15 0355     Chief Complaint  Patient presents with  . 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 She reports she worked 18 hours on October 7, and has worked over 60 hours this past week. She reports that she pushes to screen forward and backward. This morning about 1 AM she woke up with pain bilaterally in her lower ribs that she describes as a soreness and a dull feeling. She took a half a Percocet and went back to sleep. She reports about 30 minutes prior to arrival she woke up and felt very short of breath. She states she had in addition to the bilateral rib pain a central chest dullness. She got diaphoretic and felt nauseated and had dry heaves,and  she states she had the feeling she had to urinate and defecate. She states initially her arms were numb bilaterally and then both her legs became numb bilaterally. She states her husband gave her nitroglycerin and after the second one her pain resolved. She states the chest pain lasted about 15 minutes. She describes no pain now. She states she's never had this before. She states nothing she did made the pain worse, the nitroglycerin was only thing that made it feel better. She states she is still currently pain-free.  Family history she states her father is alive at age 65 and has had open heart surgery in his 80s, her paternal grandfather died in his 21s after open heart surgery, her maternal grandmother died in her 79s from an MI, her mother died in her 3s from COPD.  Patient denies being on any hormone replacement. She was on hormones after hysterectomy (no oophorectomy) until she was in her 12s and they stopped it due to her fibrocystic  breast disease. She reports she still has her ovaries and she has been having night sweats for the last couple weeks. We discussed she is probably going starting to go through menopause.   PCP Dr Hilma Favors  Past Medical History  Diagnosis Date  . Chronic back pain   . Normal cardiac stress test 2010  . Hypertension   . Asthma   . Thyroid disease   . PONV (postoperative nausea and vomiting)   . Hypothyroidism   . History of kidney stones   . Mitral valve prolapse    Past Surgical History  Procedure Laterality Date  . Cholecystectomy    . Abdominal hysterectomy    . Cardiac catheterization  11/2009    normal coronary arteries ([facility])  . Back surgery      x3-ruptured disc  . Knee arthroscopy Left   . Cyst removal hand Right   . Bone spur Right     foot  . Colonoscopy N/A 07/15/2014    Procedure: COLONOSCOPY;  Surgeon: Daneil Dolin, MD;  Location: [facility];  Service: Endoscopy;  Laterality: N/A;   Family History  Problem Relation Age of Onset  . COPD Mother   . Cancer Mother     skin  . Hernia Mother   . Fibromyalgia Mother   . Hypertension Mother   . Other Father     open heart surgery  . Hypertension Father   . Hypertension Brother   . Interstitial cystitis  Daughter   . Cancer Maternal Grandmother     cervical  . Heart attack Maternal Grandmother     died at age 86  . Cancer Maternal Grandfather     colon  . Cancer Paternal Grandmother     colon,cervical,melonoma  . Other Paternal Grandmother     open heart surgery  . Arthritis Paternal Grandfather   . Leukemia Paternal Grandfather    Social History  Substance Use Topics  . Smoking status: Never Smoker   . Smokeless tobacco: Never Used  . 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   OB History    Gravida Para Term Preterm AB TAB SAB Ectopic Multiple Living   1 1        1      Review of Systems  All other systems reviewed and are  negative.     Allergies  Phenergan; Dilaudid; and Morphine and related  Home Medications   Prior to Admission medications   Medication Sig Start Date End Date Taking? Authorizing Provider  levothyroxine (SYNTHROID, LEVOTHROID) 50 MCG tablet Take 50 mcg by mouth. Takes 1 tab Monday-Thursday then take 2 tabs on Friday-Sunday.    Historical Provider, MD  methocarbamol (ROBAXIN) 500 MG tablet Take 1 or 2 po Q 6hrs for pain and muscle soreness 05/03/15   Rolland Porter, MD  naproxen (NAPROSYN) 500 MG tablet Take 1 po BID with food prn pain 05/03/15   Rolland Porter, MD  naproxen sodium (ANAPROX) 220 MG tablet Take 220 mg by mouth as needed.    Historical Provider, MD  olmesartan (BENICAR) 40 MG tablet Take 40 mg by mouth. Takes half a tab daily.    Historical Provider, MD  triamcinolone (NASACORT ALLERGY 24HR) 55 MCG/ACT AERO nasal inhaler Place 2 sprays into the nose as needed. Every other day as needed    Historical Provider, MD   BP 115/81 mmHg  Pulse 63  Temp(Src) 97.6 F (36.4 C) (Oral)  Resp 17  SpO2 100%  Vital signs normal    Physical Exam  Constitutional: She is oriented to person, place, and time. She appears well-developed and well-nourished.  Non-toxic appearance. She does not appear ill. No distress.  HENT:  Head: Normocephalic and atraumatic.  Right Ear: External ear normal.  Left Ear: External ear normal.  Nose: Nose normal. No mucosal edema or rhinorrhea.  Mouth/Throat: Oropharynx is clear and moist and mucous membranes are normal. No dental abscesses or uvula swelling.  Eyes: Conjunctivae and EOM are normal. Pupils are equal, round, and reactive to light.  Neck: Normal range of motion and full passive range of motion without pain. Neck supple.  Cardiovascular: Normal rate, regular rhythm and normal heart sounds.  Exam reveals no gallop and no friction rub.   No murmur heard. Pulmonary/Chest: Effort normal and breath sounds normal. No respiratory distress. She has no wheezes.  She has no rhonchi. She has no rales. She exhibits no tenderness and no crepitus.    Mild tenderness over the sternum  Abdominal: Soft. Normal appearance and bowel sounds are normal. She exhibits no distension. There is no tenderness. There is no rebound and no guarding.  Musculoskeletal: Normal range of motion. She exhibits no edema or tenderness.  Moves all extremities well.   Neurological: She is alert and oriented to person, place, and time. She has normal strength. No cranial nerve deficit.  Skin: Skin is warm, dry and intact. No rash noted. No  erythema. No pallor.  Psychiatric: Her speech is normal and behavior is normal. Her mood appears anxious.  Nursing note and vitals reviewed.   ED Course  Procedures (including critical care time)  Medications  ketorolac (TORADOL) 30 MG/ML injection 30 mg (not administered)    Patient was pain-free during my initial interview. When she was rechecked at 6:30 to discuss her test results she states she started some discomfort in her left rib cage area. Her first troponin is negative. A second troponin is ordered for 2-1/2 hours after the first. She was given Toradol IV for her presumed chest wall pain.  Patient was left at change of shift with Dr. Roderic Palau to get her second delta troponin.    Labs Review Results for orders placed or performed during the hospital encounter of 05/03/15  Comprehensive metabolic panel  Result Value Ref Range   Sodium 140 135 - 145 mmol/L   Potassium 3.7 3.5 - 5.1 mmol/L   Chloride 108 101 - 111 mmol/L   CO2 27 22 - 32 mmol/L   Glucose, Bld 106 (H) 65 - 99 mg/dL   BUN 24 (H) 6 - 20 mg/dL   Creatinine, Ser 0.72 0.44 - 1.00 mg/dL   Calcium 9.6 8.9 - 10.3 mg/dL   Total Protein 6.4 (L) 6.5 - 8.1 g/dL   Albumin 4.0 3.5 - 5.0 g/dL   AST 51 (H) 15 - 41 U/L   ALT 30 14 - 54 U/L   Alkaline Phosphatase 50 38 - 126 U/L   Total Bilirubin 0.9 0.3 - 1.2 mg/dL   GFR calc non Af Amer >60 >60 mL/min   GFR calc Af Amer >60  >60 mL/min   Anion gap 5 5 - 15  Troponin I  Result Value Ref Range   Troponin I <0.03 <0.031 ng/mL  CBC with Differential  Result Value Ref Range   WBC 6.0 4.0 - 10.5 K/uL   RBC 3.97 3.87 - 5.11 MIL/uL   Hemoglobin 12.4 12.0 - 15.0 g/dL   HCT 36.6 36.0 - 46.0 %   MCV 92.2 78.0 - 100.0 fL   MCH 31.2 26.0 - 34.0 pg   MCHC 33.9 30.0 - 36.0 g/dL   RDW 11.2 (L) 11.5 - 15.5 %   Platelets 149 (L) 150 - 400 K/uL   Neutrophils Relative % 75 %   Neutro Abs 4.5 1.7 - 7.7 K/uL   Lymphocytes Relative 15 %   Lymphs Abs 0.9 0.7 - 4.0 K/uL   Monocytes Relative 9 %   Monocytes Absolute 0.6 0.1 - 1.0 K/uL   Eosinophils Relative 1 %   Eosinophils Absolute 0.0 0.0 - 0.7 K/uL   Basophils Relative 0 %   Basophils Absolute 0.0 0.0 - 0.1 K/uL  D-dimer, quantitative  Result Value Ref Range   D-Dimer, Quant 0.31 0.00 - 0.48 ug/mL-FEU   Laboratory interpretation all normal  Imaging Review Dg Chest 2 View  05/03/2015   CLINICAL DATA:  Acute onset of epigastric and chest pain. Initial encounter.  EXAM: CHEST  2 VIEW  COMPARISON:  Chest radiograph performed 11/24/2009  FINDINGS: The lungs are well-aerated and clear. There is no evidence of focal opacification, pleural effusion or pneumothorax.  The heart is normal in size; the mediastinal contour is within normal limits. No acute osseous abnormalities are seen.  IMPRESSION: No acute cardiopulmonary process seen.   Electronically Signed   By: Garald Balding M.D.   On: 05/03/2015 05:12   I have personally reviewed and evaluated  these images and lab results as part of my medical decision-making.   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 A lot of repetitive motion rolling the dye forward and backward. Her symptoms are most consistent with a chest wall pain.    Final diagnoses:  Chest wall pain   New Prescriptions   METHOCARBAMOL (ROBAXIN) 500 MG TABLET    Take 1 or 2 po Q 6hrs for pain and muscle soreness   NAPROXEN (NAPROSYN) 500 MG TABLET    Take 1 po BID with food prn pain    Plan discharge  Rolland Porter, MD, Barbette Or, MD 05/03/15 [phone]

## 2015-05-03 NOTE — Discharge Instructions (Signed)
Use ice and heat for your chest wall pain. Take the medications as prescribed. Recheck if you get a fever, struggle to breathe, or seem worse.   Chest Wall Pain Chest wall pain is pain in or around the bones and muscles of your chest. Sometimes, an injury causes this pain. Sometimes, the cause may not be known. This pain may take several weeks or longer to get better. HOME CARE INSTRUCTIONS  Pay attention to any changes in your symptoms. Take these actions to help with your pain:   Rest as told by your health care provider.   Avoid activities that cause pain. These include any activities that use your chest muscles or your abdominal and side muscles to lift heavy items.   If directed, apply ice to the painful area:  Put ice in a plastic bag.  Place a towel between your skin and the bag.  Leave the ice on for 20 minutes, 2-3 times per day.  Take over-the-counter and prescription medicines only as told by your health care provider.  Do not use tobacco products, including cigarettes, chewing tobacco, and e-cigarettes. If you need help quitting, ask your health care provider.  Keep all follow-up visits as told by your health care provider. This is important. SEEK MEDICAL CARE IF:  You have a fever.  Your chest pain becomes worse.  You have new symptoms. SEEK IMMEDIATE MEDICAL CARE IF:  You have nausea or vomiting.  You feel sweaty or light-headed.  You have a cough with phlegm (sputum) or you cough up blood.  You develop shortness of breath.   This information is not intended to replace advice given to you by your health care provider. Make sure you discuss any questions you have with your health care provider.   Document Released: 07/12/2005 Document Revised: 04/02/2015 Document Reviewed: 10/07/2014 Elsevier Interactive Patient Education [employer].

## 2015-05-03 NOTE — ED Notes (Addendum)
Pt woke up from her sleep with epigastric/chest pain. EMS states she took 2 nitro and pain was relieved. Pt states she feels jittery. Pt states she also felt like she couldn't breath when she woke up and her arms and hands went numb and were tingly like she was having a panic attack.

## 2015-07-16 ENCOUNTER — Encounter: Payer: Self-pay | Admitting: Advanced Practice Midwife

## 2015-07-16 ENCOUNTER — Other Ambulatory Visit: Payer: Self-pay | Admitting: Advanced Practice Midwife

## 2015-07-29 ENCOUNTER — Telehealth: Payer: Self-pay | Admitting: Adult Health

## 2015-07-29 NOTE — Telephone Encounter (Signed)
Pt called stating that she would like a call back from [place], [place] did not state the nature of the call. Please contact pt

## 2015-07-29 NOTE — Telephone Encounter (Signed)
Left message to call.

## 2015-07-30 MED ORDER — NYSTATIN-TRIAMCINOLONE 100000-0.1 UNIT/GM-% EX CREA: 1.0000 "application " | TOPICAL_CREAM | Freq: Two times a day (BID) | CUTANEOUS | Status: DC

## 2015-07-30 MED ORDER — FLUCONAZOLE 150 MG PO TABS: ORAL_TABLET | ORAL | Status: DC

## 2015-07-30 NOTE — Telephone Encounter (Signed)
Pt used new soap and is red and itching, has appt 1/17 but does not want to come in today, will rx diflucan and mytrex,try dial unscented, soap

## 2015-07-30 NOTE — Addendum Note (Signed)
Addended by: Derrek Monaco A on: 07/30/2015 08:56 AM   Modules accepted: Orders

## 2015-08-12 ENCOUNTER — Ambulatory Visit (INDEPENDENT_AMBULATORY_CARE_PROVIDER_SITE_OTHER): Payer: BLUE CROSS/BLUE SHIELD | Admitting: Adult Health

## 2015-08-12 ENCOUNTER — Encounter: Payer: Self-pay | Admitting: Adult Health

## 2015-08-12 VITALS — BP 140/100 | HR 72 | Ht 65.0 in | Wt 145.5 lb

## 2015-08-12 DIAGNOSIS — Z01419 Encounter for gynecological examination (general) (routine) without abnormal findings: Secondary | ICD-10-CM | POA: Diagnosis not present

## 2015-08-12 DIAGNOSIS — Z1212 Encounter for screening for malignant neoplasm of rectum: Secondary | ICD-10-CM | POA: Diagnosis not present

## 2015-08-12 DIAGNOSIS — R32 Unspecified urinary incontinence: Secondary | ICD-10-CM

## 2015-08-12 DIAGNOSIS — N393 Stress incontinence (female) (male): Secondary | ICD-10-CM

## 2015-08-12 DIAGNOSIS — I10 Essential (primary) hypertension: Secondary | ICD-10-CM

## 2015-08-12 HISTORY — DX: Unspecified urinary incontinence: R32

## 2015-08-12 LAB — HEMOCCULT GUIAC POC 1CARD (OFFICE): FECAL OCCULT BLD: NEGATIVE

## 2015-08-12 NOTE — Progress Notes (Signed)
Patient ID: [name], female   DOB: May 28, 1961, 55 y.o.   MRN: [id number] History of Present Illness: [name] is a 55 year old white female, married, sp hysterectomy for CIS in for well woman gyn exam,had normal pap with negative HPV 07/08/14.She has some urine leakage if laughs.She has had back pain and BP has been elevated, has seen Dr Hilma Favors.She got flu shot and B12 in October. PCP is Dr Hilma Favors.   Current Medications, Allergies, Past Medical History, Past Surgical History, Family History and Social History were reviewed in [employer] record.     Review of Systems: Patient denies any headaches, hearing loss, fatigue, blurred vision, shortness of breath, chest pain, abdominal pain, problems with bowel movements, or intercourse. No joint pain or mood swings. See HPI for positives.   Physical Exam:BP 140/100 mmHg  Pulse 72  Ht 5\' 5"  (1.651 m)  Wt 145 lb 8 oz (65.998 kg)  BMI 24.21 kg/m2 General:  Well developed, well nourished, no acute distress Skin:  Warm and dry Neck:  Midline trachea, enlarged thyroid, good ROM, no lymphadenopathy Lungs; Clear to auscultation bilaterally Breast:  No dominant palpable mass, retraction, or nipple discharge,had SCC removed from chest Cardiovascular: Regular rate and rhythm Abdomen:  Soft, non tender, no hepatosplenomegaly Pelvic:  External genitalia is normal in appearance, no lesions.  The vagina is normal in appearance. Urethra has no lesions or masses. The cervix and uterus are absent. No adnexal masses or tenderness noted.Bladder is non tender, no masses felt. Rectal: Good sphincter tone, no polyps, or hemorrhoids felt.  Hemoccult negative. Extremities/musculoskeletal:  No swelling or varicosities noted, no clubbing or cyanosis Psych:  No mood changes, alert and cooperative,seems happy   Impression: Well woman gyn exam no pap Hypertension SUI   Plan: Get mammogram now is past due and yearly Labs with  PCP Colonoscopy per GI, had 2015 Physical in 1 year Follow up with Dr Hilma Favors about BP Do kegels

## 2015-08-12 NOTE — Patient Instructions (Signed)
Physical in 1 year Mammogram now and yearly Labs with PCP Follow up with PCP about BP Colonoscopy per GI

## 2015-11-20 DIAGNOSIS — R202 Paresthesia of skin: Secondary | ICD-10-CM | POA: Diagnosis not present

## 2015-11-20 DIAGNOSIS — E890 Postprocedural hypothyroidism: Secondary | ICD-10-CM | POA: Diagnosis not present

## 2015-11-20 DIAGNOSIS — I10 Essential (primary) hypertension: Secondary | ICD-10-CM | POA: Diagnosis not present

## 2015-11-21 DIAGNOSIS — E890 Postprocedural hypothyroidism: Secondary | ICD-10-CM | POA: Diagnosis not present

## 2015-11-21 DIAGNOSIS — I10 Essential (primary) hypertension: Secondary | ICD-10-CM | POA: Diagnosis not present

## 2015-11-21 DIAGNOSIS — E049 Nontoxic goiter, unspecified: Secondary | ICD-10-CM | POA: Diagnosis not present

## 2015-11-24 ENCOUNTER — Other Ambulatory Visit (HOSPITAL_COMMUNITY): Payer: Self-pay | Admitting: Endocrinology

## 2015-11-24 DIAGNOSIS — E049 Nontoxic goiter, unspecified: Secondary | ICD-10-CM

## 2015-11-28 ENCOUNTER — Ambulatory Visit (HOSPITAL_COMMUNITY)
Admission: RE | Admit: 2015-11-28 | Discharge: 2015-11-28 | Disposition: A | Discharge status: Home / Self Care | Payer: BLUE CROSS/BLUE SHIELD | Source: Ambulatory Visit | Attending: Endocrinology | Admitting: Endocrinology

## 2015-11-28 DIAGNOSIS — E049 Nontoxic goiter, unspecified: Secondary | ICD-10-CM | POA: Diagnosis not present

## 2015-12-01 ENCOUNTER — Ambulatory Visit: Payer: BLUE CROSS/BLUE SHIELD | Admitting: Pediatrics

## 2015-12-11 DIAGNOSIS — M5030 Other cervical disc degeneration, unspecified cervical region: Secondary | ICD-10-CM | POA: Diagnosis not present

## 2015-12-11 DIAGNOSIS — M5412 Radiculopathy, cervical region: Secondary | ICD-10-CM | POA: Diagnosis not present

## 2015-12-11 DIAGNOSIS — Z6823 Body mass index (BMI) 23.0-23.9, adult: Secondary | ICD-10-CM | POA: Diagnosis not present

## 2015-12-12 DIAGNOSIS — G5602 Carpal tunnel syndrome, left upper limb: Secondary | ICD-10-CM | POA: Diagnosis not present

## 2015-12-15 ENCOUNTER — Ambulatory Visit (INDEPENDENT_AMBULATORY_CARE_PROVIDER_SITE_OTHER): Payer: BLUE CROSS/BLUE SHIELD | Admitting: Pediatrics

## 2015-12-15 ENCOUNTER — Encounter: Payer: Self-pay | Admitting: Pediatrics

## 2015-12-15 VITALS — BP 130/78 | HR 76 | Temp 98.2°F | Resp 16 | Ht 64.57 in | Wt 144.2 lb

## 2015-12-15 DIAGNOSIS — T7800XA Anaphylactic reaction due to unspecified food, initial encounter: Secondary | ICD-10-CM

## 2015-12-15 DIAGNOSIS — J301 Allergic rhinitis due to pollen: Secondary | ICD-10-CM | POA: Diagnosis not present

## 2015-12-15 DIAGNOSIS — J4520 Mild intermittent asthma, uncomplicated: Secondary | ICD-10-CM

## 2015-12-15 MED ORDER — EPINEPHRINE 0.3 MG/0.3ML IJ SOAJ: 0.3000 mg | Freq: Once | INTRAMUSCULAR | Status: AC

## 2015-12-15 MED ORDER — ALBUTEROL SULFATE HFA 108 (90 BASE) MCG/ACT IN AERS: 2.0000 | INHALATION_SPRAY | RESPIRATORY_TRACT | Status: AC | PRN

## 2015-12-15 MED ORDER — OMEPRAZOLE 20 MG PO CPDR: 20.0000 mg | DELAYED_RELEASE_CAPSULE | Freq: Two times a day (BID) | ORAL | Status: DC

## 2015-12-15 MED ORDER — FLUTICASONE PROPIONATE 50 MCG/ACT NA SUSP: 2.0000 | Freq: Every day | NASAL | Status: DC

## 2015-12-15 NOTE — Patient Instructions (Addendum)
Environmental control of dust and mold Zyrtec 10 mg once a day if needed for runny nose Fluticasone 2 sprays per nostril once a day for stuffy nose [place] you[place] the [place] , take montelukast  10 mg once a day while there. You may use Pro-air 2 puffs every 4 hours if needed for wheezing or coughing spells. You may use Pro-air 2 puffs 5-15  minutes before exercise You may use Zyrtec and montelukast together  Avoid the peanut butter oatmeal bar. If you have an allergic reaction take Benadryl 50 mg every 4 hours and if you have life-threatening symptoms inject with EpiPen 0.3 mg.  Omeprazole 20 mg-one capsule twice a day for a month.Did it  help the clearing of the throat? . In that case decrease to once a day for a month and then stop if doing well

## 2015-12-15 NOTE — Progress Notes (Signed)
[address] [place] [address] Dept: [phone]  New Patient Note  Patient ID: [name], female    DOB: [birth year]  Age: 55 y.o. MRN: [id number] Date of Office Visit: 12/15/2015 Referring provider: No referring provider defined for this encounter.    Chief Complaint: Rash  HPI [name] presents for evaluation of a rash that she had for 3 or  4 months. There was  itching with  the rash. She had been eating peanut butter and oatmeal bars averaging about 10 per day. She stopped eating  these  and the rash went away. She is concerned about food allergies. She has a history of seasonal allergic rhinitis for several years. She has aggravation of her symptoms on exposure to dust, cigarette smoke and weather changes. She had asthma in childhood and has some shortness of breath with exercise especially when she goes  up the stairs at a [employer] . She has had chronic clearing of her throat. She has a history of mild eczema.  Review of Systems  Constitutional: Negative.   HENT:       Seasonal allergic rhinitis for several years  Eyes: Negative.   Respiratory:       History of asthma in childhood. Symptoms shortness of breath at times while going up  stairs at the [place]  Cardiovascular:       Hypertension  Gastrointestinal:       Chronic clearing of her throat. No heartburn. Cholecystectomy.  Genitourinary:       Hysterectomy  Musculoskeletal:       Surgery on her back and one knee.  Skin:       Rash for 3 or 4 months while eating a lot of peanut-oatmeal bars. History of eczema. History of squamous cell carcinoma  Neurological: Negative.   Endo/Heme/Allergies:       Hyperthyroidism treated by  radiation. No diabetes.  Psychiatric/Behavioral: Negative.     Outpatient Encounter Prescriptions as of 12/15/2015  Medication Sig  . levothyroxine (SYNTHROID, LEVOTHROID) 75 MCG tablet Take 75 mcg by mouth daily before breakfast.  . montelukast (SINGULAIR) 10 MG  tablet Take 10 mg by mouth as needed.  Marland Kitchen olmesartan (BENICAR) 20 MG tablet Take 20 mg by mouth daily.  Marland Kitchen albuterol (PROAIR HFA) 108 (90 Base) MCG/ACT inhaler Inhale 2 puffs into the lungs every 4 (four) hours as needed for wheezing or shortness of breath.  . diazepam (VALIUM) 2 MG tablet Take 2 mg by mouth every 8 (eight) hours as needed. Reported on 12/15/2015  . EPINEPHrine 0.3 mg/0.3 mL IJ SOAJ injection Inject 0.3 mLs (0.3 mg total) into the muscle once.  . fluticasone (FLONASE) 50 MCG/ACT nasal spray Place 2 sprays into both nostrils daily.  Marland Kitchen levothyroxine (SYNTHROID, LEVOTHROID) 50 MCG tablet Take 50 mcg by mouth daily before breakfast. Reported on 12/15/2015  . olmesartan (BENICAR) 40 MG tablet Take 40 mg by mouth daily. Reported on 12/15/2015  . omeprazole (PRILOSEC) 20 MG capsule Take 1 capsule (20 mg total) by mouth 2 (two) times daily before a meal.  . UNABLE TO FIND Reported on 12/15/2015   No facility-administered encounter medications on file as of 12/15/2015.     Drug Allergies:  Allergies  Allergen Reactions  . Phenergan [Promethazine Hcl] Nausea And Vomiting  . Dilaudid [Hydromorphone Hcl] Nausea And Vomiting    Nausea   . Morphine And Related Nausea And Vomiting    nausea     Family History: Rafael's family history  includes Allergic rhinitis in her daughter; Arthritis in her paternal grandfather; Asthma in her daughter and mother; COPD in her mother; Cancer in her maternal grandfather, maternal grandmother, mother, and paternal grandmother; Fibromyalgia in her mother; Heart attack in her maternal grandmother; Hernia in her mother; Hypertension in her brother, father, and mother; Interstitial cystitis in her daughter; Leukemia in her paternal grandfather; Other in her father and paternal grandmother..  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 There are no pets in the home. She is not exposed to cigarette smoking. She has never smoked cigarettes.  Physical Exam: BP  130/78 mmHg  Pulse 76  Temp(Src) 98.2 F (36.8 C) (Oral)  Resp 16  Ht 5' 4.57" (1.64 m)  Wt 144 lb 2.9 oz (65.4 kg)  BMI 24.32 kg/m2   Physical Exam  Constitutional: She is oriented to person, place, and time. She appears well-developed and well-nourished.  HENT:  Eyes normal. Ears normal. Nose mild swelling of nasal turbinates with clear nasal discharge. Pharynx normal.  Neck: Neck supple. No thyromegaly present.  Cardiovascular:  S1 and S2 normal no murmurs  Pulmonary/Chest:  Clear to percussion and auscultation  Abdominal: Soft. There is no tenderness (no hepatosplen).  Lymphadenopathy:    She has no cervical adenopathy.  Neurological: She is alert and oriented to person, place, and time.  Skin:  Clear  Psychiatric: She has a normal mood and affect. Her behavior is normal. Judgment and thought content normal.  Vitals reviewed.   Diagnostics: FVC 2.79 L FEV1 2.22 L. Predicted FVC 3.24 L predicted from 12.65 liters. After albuterol 2 puffs FVC 2.81 L FEV1 2.26 L-the spirometry is in the normal range and there was no significant improvement after albuterol  Allergy skin tests were positive to tree pollens and molds on intradermal testing only. Skin testing to foods was positive to peanut. The other foods testing negative  Assessment Assessment and Plan: 1. Mild intermittent asthma, uncomplicated   2. Allergic rhinitis due to pollen   3. Allergy with anaphylaxis due to food, initial encounter     Meds ordered this encounter  Medications  . albuterol (PROAIR HFA) 108 (90 Base) MCG/ACT inhaler    Sig: Inhale 2 puffs into the lungs every 4 (four) hours as needed for wheezing or shortness of breath.    Dispense:  1 Inhaler    Refill:  3  . fluticasone (FLONASE) 50 MCG/ACT nasal spray    Sig: Place 2 sprays into both nostrils daily.    Dispense:  16 g    Refill:  5  . EPINEPHrine 0.3 mg/0.3 mL IJ SOAJ injection    Sig: Inject 0.3 mLs (0.3 mg total) into the muscle once.      Dispense:  2 Device    Refill:  2  . omeprazole (PRILOSEC) 20 MG capsule    Sig: Take 1 capsule (20 mg total) by mouth 2 (two) times daily before a meal.    Dispense:  60 capsule    Refill:  2    Patient Instructions  Environmental control of dust and mold Zyrtec 10 mg once a day if needed for runny nose Fluticasone 2 sprays per nostril once a day for stuffy nose If you're going [place] the [place] , take montelukast  10 mg once a day while there. You may use Pro-air 2 puffs every 4 hours if needed for wheezing or coughing spells. You may use Pro-air 2 puffs 5-15  minutes before exercise You may use Zyrtec and  montelukast together  Avoid the peanut butter oatmeal bar. If you have an allergic reaction take Benadryl 50 mg every 4 hours and if you have life-threatening symptoms inject with EpiPen 0.3 mg.  Omeprazole 20 mg-one capsule twice a day for a month.Did it  help the clearing of the throat? . In that case decrease to once a day for a month and then stop if doing well    Return if symptoms worsen or fail to improve.   Thank you for the opportunity to care for this patient.  Please do not hesitate to contact me with questions.  Penne Lash, M.D.  [facility] [address] [place], [place] [address] [phone]

## 2015-12-25 DIAGNOSIS — M50223 Other cervical disc displacement at C6-C7 level: Secondary | ICD-10-CM | POA: Diagnosis not present

## 2016-02-13 DIAGNOSIS — Z6823 Body mass index (BMI) 23.0-23.9, adult: Secondary | ICD-10-CM | POA: Diagnosis not present

## 2016-02-13 DIAGNOSIS — R1013 Epigastric pain: Secondary | ICD-10-CM | POA: Diagnosis not present

## 2016-02-13 DIAGNOSIS — Z1389 Encounter for screening for other disorder: Secondary | ICD-10-CM | POA: Diagnosis not present

## 2016-03-23 ENCOUNTER — Other Ambulatory Visit (HOSPITAL_COMMUNITY): Payer: Self-pay | Admitting: Family Medicine

## 2016-03-23 DIAGNOSIS — R1013 Epigastric pain: Secondary | ICD-10-CM

## 2016-03-26 ENCOUNTER — Ambulatory Visit (HOSPITAL_COMMUNITY)
Admission: RE | Admit: 2016-03-26 | Discharge: 2016-03-26 | Disposition: A | Discharge status: Home / Self Care | Payer: BLUE CROSS/BLUE SHIELD | Source: Ambulatory Visit | Attending: Family Medicine | Admitting: Family Medicine

## 2016-03-26 DIAGNOSIS — N281 Cyst of kidney, acquired: Secondary | ICD-10-CM | POA: Insufficient documentation

## 2016-03-26 DIAGNOSIS — R1013 Epigastric pain: Secondary | ICD-10-CM

## 2016-03-26 DIAGNOSIS — Z9049 Acquired absence of other specified parts of digestive tract: Secondary | ICD-10-CM | POA: Insufficient documentation

## 2016-04-01 ENCOUNTER — Emergency Department (HOSPITAL_COMMUNITY)
Admission: EM | Admit: 2016-04-01 | Discharge: 2016-04-01 | Disposition: A | Discharge status: Home / Self Care | Payer: BLUE CROSS/BLUE SHIELD | Attending: Emergency Medicine | Admitting: Emergency Medicine

## 2016-04-01 ENCOUNTER — Encounter (HOSPITAL_COMMUNITY): Payer: Self-pay | Admitting: Emergency Medicine

## 2016-04-01 ENCOUNTER — Emergency Department (HOSPITAL_COMMUNITY): Payer: BLUE CROSS/BLUE SHIELD

## 2016-04-01 DIAGNOSIS — J45909 Unspecified asthma, uncomplicated: Secondary | ICD-10-CM | POA: Diagnosis not present

## 2016-04-01 DIAGNOSIS — Z79899 Other long term (current) drug therapy: Secondary | ICD-10-CM | POA: Insufficient documentation

## 2016-04-01 DIAGNOSIS — R1013 Epigastric pain: Secondary | ICD-10-CM

## 2016-04-01 DIAGNOSIS — I10 Essential (primary) hypertension: Secondary | ICD-10-CM | POA: Diagnosis not present

## 2016-04-01 DIAGNOSIS — E039 Hypothyroidism, unspecified: Secondary | ICD-10-CM | POA: Insufficient documentation

## 2016-04-01 DIAGNOSIS — Z791 Long term (current) use of non-steroidal anti-inflammatories (NSAID): Secondary | ICD-10-CM | POA: Insufficient documentation

## 2016-04-01 LAB — COMPREHENSIVE METABOLIC PANEL
ALBUMIN: 4.4 g/dL (ref 3.5–5.0)
ALT: 13 U/L — ABNORMAL LOW (ref 14–54)
AST: 16 U/L (ref 15–41)
Alkaline Phosphatase: 68 U/L (ref 38–126)
Anion gap: 6 (ref 5–15)
BUN: 18 mg/dL (ref 6–20)
CHLORIDE: 104 mmol/L (ref 101–111)
CO2: 29 mmol/L (ref 22–32)
Calcium: 9 mg/dL (ref 8.9–10.3)
Creatinine, Ser: 0.69 mg/dL (ref 0.44–1.00)
GFR calc Af Amer: >60 mL/min (ref >60)
GFR calc non Af Amer: >60 mL/min (ref >60)
GLUCOSE: 107 mg/dL — AB (ref 65–99)
POTASSIUM: 3.5 mmol/L (ref 3.5–5.1)
SODIUM: 139 mmol/L (ref 135–145)
TOTAL PROTEIN: 7 g/dL (ref 6.5–8.1)
Total Bilirubin: 0.5 mg/dL (ref 0.3–1.2)

## 2016-04-01 LAB — CBC
HEMATOCRIT: 39.2 % (ref 36.0–46.0)
Hemoglobin: 13.3 g/dL (ref 12.0–15.0)
MCH: 31.3 pg (ref 26.0–34.0)
MCHC: 33.9 g/dL (ref 30.0–36.0)
MCV: 92.2 fL (ref 78.0–100.0)
Platelets: 172 10*3/uL (ref 150–400)
RBC: 4.25 MIL/uL (ref 3.87–5.11)
RDW: 12.2 % (ref 11.5–15.5)
WBC: 4 10*3/uL (ref 4.0–10.5)

## 2016-04-01 LAB — URINALYSIS, ROUTINE W REFLEX MICROSCOPIC
BILIRUBIN URINE: NEGATIVE
GLUCOSE, UA: NEGATIVE mg/dL
Hgb urine dipstick: NEGATIVE
KETONES UR: NEGATIVE mg/dL
LEUKOCYTES UA: NEGATIVE
NITRITE: NEGATIVE
PH: 7 (ref 5.0–8.0)
Protein, ur: NEGATIVE mg/dL
SPECIFIC GRAVITY, URINE: 1.015 (ref 1.005–1.030)

## 2016-04-01 LAB — LIPASE, BLOOD: LIPASE: 15 U/L (ref 11–51)

## 2016-04-01 MED ORDER — IOPAMIDOL (ISOVUE-300) INJECTION 61%
100.0000 mL | Freq: Once | INTRAVENOUS | Status: AC | PRN
  Administered 2016-04-01: 100 mL via INTRAVENOUS

## 2016-04-01 MED ORDER — IOPAMIDOL (ISOVUE-300) INJECTION 61%
INTRAVENOUS | Status: AC
  Administered 2016-04-01: 30 mL
  Filled 2016-04-01: qty 30

## 2016-04-01 NOTE — Discharge Instructions (Signed)
It is okay to take Aleve as directed for pain. Call Dr. Hilma Favors to get rechecked in his office in one week. Tell office staff you had testing here including blood work and CT scan  so that they can get the results.Your blood pressure should be rechecked. Tonight's was elevated at 174/89.

## 2016-04-01 NOTE — ED Triage Notes (Signed)
PT c/o epigastric pain with nausea x2 weeks. PT denies any vomiting or diarrhea. PT also states increase in urinary frequency.

## 2016-04-01 NOTE — ED Provider Notes (Signed)
[facility] DEPT Provider Note   CSN: [id number] Arrival date & time: 04/01/16  1615     History   Chief Complaint Chief Complaint  Patient presents with  . Abdominal Pain    HPI [name] is a 55 y.o. female.  HPI Complains of epigastric abdominal pain onset 1 month ago. Constant. Nothing makes pain better or worse. Last bowel movement yesterday, normal. No nausea or vomiting. No chest pain. Symptoms not changed by exertion. She is treated self with Aleve, without relief. Pain is constant and moderate. She seen her primary care physician who ordered an ultrasound of her abdomen, showing no acute abnormality. 1.1 cm simple  cyst right kidney. She reports that her doctor's office called her earlier today and advised her to come to the emergency department for further evaluation. No other associated symptoms  Past Medical History:  Diagnosis Date  . Asthma   . Chronic back pain   . History of kidney stones   . Hypertension   . Hypothyroidism   . Mitral valve prolapse   . Normal cardiac stress test 2010  . PONV (postoperative nausea and vomiting)   . SCC (squamous cell carcinoma)    chest  . Thyroid disease   . Urinary incontinence 08/12/2015    Patient Active Problem List   Diagnosis Date Noted  . Mild intermittent asthma 12/15/2015  . Allergic rhinitis due to pollen 12/15/2015  . 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 Hypertension 08/12/2015  . Urinary incontinence 08/12/2015  . Special screening for malignant neoplasms, colon     Past Surgical History:  Procedure Laterality Date  . ABDOMINAL HYSTERECTOMY    . BACK SURGERY     x3-ruptured disc  . bone spur Right    foot  . CARDIAC CATHETERIZATION  11/2009   normal coronary arteries ([facility])  . CHOLECYSTECTOMY    . COLONOSCOPY N/A 07/15/2014   Procedure: COLONOSCOPY;  Surgeon: Daneil Dolin, MD;  Location: [facility];  Service: Endoscopy;  Laterality: N/A;  . CYST REMOVAL HAND Right   .  KNEE ARTHROSCOPY Left     OB History    Gravida Para Term Preterm AB Living   1 1       1    SAB TAB Ectopic Multiple Live Births                   Home Medications    Prior to Admission medications   Medication Sig Start Date End Date Taking? Authorizing Provider  albuterol (PROAIR HFA) 108 (90 Base) MCG/ACT inhaler Inhale 2 puffs into the lungs every 4 (four) hours as needed for wheezing or shortness of breath. 12/15/15  Yes Charlies Silvers, MD  Cholecalciferol (VITAMIN D) 2000 units CAPS Take 1,000-2,000 Units by mouth daily.   Yes Historical Provider, MD  EPINEPHrine 0.3 mg/0.3 mL IJ SOAJ injection Inject 0.3 mLs (0.3 mg total) into the muscle once. 12/15/15  Yes Charlies Silvers, MD  levothyroxine (SYNTHROID, LEVOTHROID) 75 MCG tablet Take 75 mcg by mouth daily before breakfast.   Yes Historical Provider, MD  montelukast (SINGULAIR) 10 MG tablet Take 10 mg by mouth daily as needed (for allergies).    Yes Historical Provider, MD  Multiple Vitamin (MULTIVITAMIN WITH MINERALS) TABS tablet Take 1 tablet by mouth daily.   Yes Historical Provider, MD  naproxen sodium (ALEVE) 220 MG tablet Take 440 mg by mouth daily as needed (for pain).   Yes Historical Provider, MD  olmesartan (  BENICAR) 20 MG tablet Take 20 mg by mouth daily.   Yes Historical Provider, MD  vitamin E 1000 UNIT capsule Take 1,000 Units by mouth daily.   Yes Historical Provider, MD    Family History Family History  Problem Relation Age of Onset  . COPD Mother   . Cancer Mother     skin  . Hernia Mother   . Fibromyalgia Mother   . Hypertension Mother   . Asthma Mother   . Other Father     open heart surgery  . Hypertension Father   . Hypertension Brother   . Interstitial cystitis Daughter   . Allergic rhinitis Daughter   . Asthma Daughter   . Cancer Maternal Grandmother     cervical  . Heart attack Maternal Grandmother     died at age 19  . Cancer Maternal Grandfather     colon  . Cancer Paternal Grandmother      colon,cervical,melonoma  . Other Paternal Grandmother     open heart surgery  . Arthritis Paternal Grandfather   . Leukemia Paternal Grandfather     Social History Social History  Substance Use Topics  . Smoking status: Never Smoker  . Smokeless tobacco: Never Used  . Alcohol use No     Allergies   Phenergan [promethazine hcl]; Dilaudid [hydromorphone hcl]; Morphine and related; and Peanut-containing drug products   Review of Systems Review of Systems  Constitutional: Negative.   HENT: Negative.   Respiratory: Negative.   Cardiovascular: Negative.   Gastrointestinal: Positive for abdominal pain.  Musculoskeletal: Negative.   Skin: Negative.   Neurological: Negative.   Psychiatric/Behavioral: Negative.   All other systems reviewed and are negative.    Physical Exam Updated Vital Signs BP 184/85 (BP Location: Left Arm)   Pulse 82   Temp 98 F (36.7 C) (Oral)   Resp 16   Ht 5\' 5"  (1.651 m)   Wt 146 lb (66.2 kg)   SpO2 100%   BMI 24.30 kg/m   Physical Exam  Constitutional: She appears well-developed and well-nourished. No distress.  HENT:  Head: Normocephalic and atraumatic.  Eyes: Conjunctivae are normal. Pupils are equal, round, and reactive to light.  Neck: Neck supple. No tracheal deviation present. No thyromegaly present.  Cardiovascular: Normal rate and regular rhythm.   No murmur heard. Pulmonary/Chest: Effort normal and breath sounds normal.  Abdominal: Soft. Bowel sounds are normal. She exhibits no distension and no mass. There is tenderness. There is no rebound and no guarding. No hernia.  Tender at epigastrium  Musculoskeletal: Normal range of motion. She exhibits no edema or tenderness.  Neurological: She is alert. Coordination normal.  Skin: Skin is warm and dry. No rash noted.  Psychiatric: She has a normal mood and affect.  Nursing note and vitals reviewed.   Declines pain medicine  9:25 PM resting comfortably. ED Treatments /  Results  Labs (all labs ordered are listed, but only abnormal results are displayed) Labs Reviewed  COMPREHENSIVE METABOLIC PANEL - Abnormal; Notable for the following:       Result Value   Glucose, Bld 107 (*)    ALT 13 (*)    All other components within normal limits  LIPASE, BLOOD  CBC  URINALYSIS, ROUTINE W REFLEX MICROSCOPIC (NOT AT [facility])    EKG  EKG Interpretation None       Radiology No results found.  Procedures Procedures (including critical care time)  Medications Ordered in ED Medications - No data to display  Initial Impression / Assessment and Plan / ED Course  I have reviewed the triage vital signs and the nursing notes.  Pertinent labs & imaging results that were available during my care of the patient were reviewed by me and considered in my medical decision making (see chart for details).  Clinical Course    Plan she can use Aleve as directed for pain. She's been told not to take Tylenol due to elevated liver function test in the past. She also reports that she had an ERCP and had pancreatitis and was told never to take Tylenol. Blood pressure recheck one week  Final Clinical Impressions(s) / ED Diagnoses  Diagnosis #1 epigastric pain #2 elevated blood pressure Final diagnoses:  None    New Prescriptions New Prescriptions   No medications on file     Orlie Dakin, MD 04/01/16 2129

## 2016-04-07 DIAGNOSIS — Z6824 Body mass index (BMI) 24.0-24.9, adult: Secondary | ICD-10-CM | POA: Diagnosis not present

## 2016-04-07 DIAGNOSIS — D179 Benign lipomatous neoplasm, unspecified: Secondary | ICD-10-CM | POA: Diagnosis not present

## 2016-06-08 DIAGNOSIS — M791 Myalgia: Secondary | ICD-10-CM | POA: Diagnosis not present

## 2016-06-08 DIAGNOSIS — Z23 Encounter for immunization: Secondary | ICD-10-CM | POA: Diagnosis not present

## 2016-06-08 DIAGNOSIS — R5383 Other fatigue: Secondary | ICD-10-CM | POA: Diagnosis not present

## 2016-06-08 DIAGNOSIS — Z6823 Body mass index (BMI) 23.0-23.9, adult: Secondary | ICD-10-CM | POA: Diagnosis not present

## 2016-06-10 DIAGNOSIS — Z85828 Personal history of other malignant neoplasm of skin: Secondary | ICD-10-CM | POA: Diagnosis not present

## 2016-06-10 DIAGNOSIS — D485 Neoplasm of uncertain behavior of skin: Secondary | ICD-10-CM | POA: Diagnosis not present

## 2016-06-10 DIAGNOSIS — L910 Hypertrophic scar: Secondary | ICD-10-CM | POA: Diagnosis not present

## 2016-08-02 DIAGNOSIS — Z6824 Body mass index (BMI) 24.0-24.9, adult: Secondary | ICD-10-CM | POA: Diagnosis not present

## 2016-08-02 DIAGNOSIS — Z1389 Encounter for screening for other disorder: Secondary | ICD-10-CM | POA: Diagnosis not present

## 2016-08-02 DIAGNOSIS — H6590 Unspecified nonsuppurative otitis media, unspecified ear: Secondary | ICD-10-CM | POA: Diagnosis not present

## 2016-08-05 DIAGNOSIS — Z1389 Encounter for screening for other disorder: Secondary | ICD-10-CM | POA: Diagnosis not present

## 2016-08-05 DIAGNOSIS — Z6824 Body mass index (BMI) 24.0-24.9, adult: Secondary | ICD-10-CM | POA: Diagnosis not present

## 2016-08-05 DIAGNOSIS — J329 Chronic sinusitis, unspecified: Secondary | ICD-10-CM | POA: Diagnosis not present

## 2016-08-05 DIAGNOSIS — G508 Other disorders of trigeminal nerve: Secondary | ICD-10-CM | POA: Diagnosis not present

## 2016-08-05 DIAGNOSIS — H6992 Unspecified Eustachian tube disorder, left ear: Secondary | ICD-10-CM | POA: Diagnosis not present

## 2016-08-05 DIAGNOSIS — R202 Paresthesia of skin: Secondary | ICD-10-CM | POA: Diagnosis not present

## 2016-08-12 DIAGNOSIS — D485 Neoplasm of uncertain behavior of skin: Secondary | ICD-10-CM | POA: Diagnosis not present

## 2016-08-12 DIAGNOSIS — D225 Melanocytic nevi of trunk: Secondary | ICD-10-CM | POA: Diagnosis not present

## 2016-08-16 ENCOUNTER — Other Ambulatory Visit: Payer: BLUE CROSS/BLUE SHIELD | Admitting: Adult Health

## 2016-08-19 ENCOUNTER — Other Ambulatory Visit (HOSPITAL_COMMUNITY): Payer: Self-pay | Admitting: Internal Medicine

## 2016-08-19 DIAGNOSIS — R51 Headache: Principal | ICD-10-CM

## 2016-08-19 DIAGNOSIS — R519 Headache, unspecified: Secondary | ICD-10-CM

## 2016-08-27 ENCOUNTER — Ambulatory Visit (HOSPITAL_COMMUNITY)
Admission: RE | Admit: 2016-08-27 | Discharge: 2016-08-27 | Disposition: A | Discharge status: Home / Self Care | Payer: BLUE CROSS/BLUE SHIELD | Source: Ambulatory Visit | Attending: Internal Medicine | Admitting: Internal Medicine

## 2016-08-27 DIAGNOSIS — R519 Headache, unspecified: Secondary | ICD-10-CM

## 2016-08-27 DIAGNOSIS — R51 Headache: Secondary | ICD-10-CM | POA: Insufficient documentation

## 2016-08-30 ENCOUNTER — Encounter: Payer: Self-pay | Admitting: Adult Health

## 2016-08-30 ENCOUNTER — Other Ambulatory Visit (HOSPITAL_COMMUNITY)
Admission: RE | Admit: 2016-08-30 | Discharge: 2016-08-30 | Disposition: A | Discharge status: Home / Self Care | Payer: BLUE CROSS/BLUE SHIELD | Source: Ambulatory Visit | Attending: Adult Health | Admitting: Adult Health

## 2016-08-30 ENCOUNTER — Ambulatory Visit (INDEPENDENT_AMBULATORY_CARE_PROVIDER_SITE_OTHER): Payer: BLUE CROSS/BLUE SHIELD | Admitting: Adult Health

## 2016-08-30 VITALS — BP 170/90 | HR 72 | Ht 65.0 in | Wt 148.5 lb

## 2016-08-30 DIAGNOSIS — I10 Essential (primary) hypertension: Secondary | ICD-10-CM | POA: Diagnosis not present

## 2016-08-30 DIAGNOSIS — Z1151 Encounter for screening for human papillomavirus (HPV): Secondary | ICD-10-CM | POA: Diagnosis not present

## 2016-08-30 DIAGNOSIS — Z1211 Encounter for screening for malignant neoplasm of colon: Secondary | ICD-10-CM | POA: Diagnosis not present

## 2016-08-30 DIAGNOSIS — Z90710 Acquired absence of both cervix and uterus: Secondary | ICD-10-CM

## 2016-08-30 DIAGNOSIS — Z01419 Encounter for gynecological examination (general) (routine) without abnormal findings: Secondary | ICD-10-CM | POA: Insufficient documentation

## 2016-08-30 DIAGNOSIS — R300 Dysuria: Secondary | ICD-10-CM | POA: Insufficient documentation

## 2016-08-30 DIAGNOSIS — R319 Hematuria, unspecified: Secondary | ICD-10-CM | POA: Diagnosis not present

## 2016-08-30 DIAGNOSIS — Z08 Encounter for follow-up examination after completed treatment for malignant neoplasm: Secondary | ICD-10-CM

## 2016-08-30 DIAGNOSIS — Z1212 Encounter for screening for malignant neoplasm of rectum: Secondary | ICD-10-CM

## 2016-08-30 DIAGNOSIS — R232 Flushing: Secondary | ICD-10-CM

## 2016-08-30 LAB — POCT URINALYSIS DIPSTICK
Glucose, UA: NEGATIVE
Ketones, UA: NEGATIVE
LEUKOCYTES UA: NEGATIVE
NITRITE UA: NEGATIVE
PROTEIN UA: NEGATIVE

## 2016-08-30 LAB — HEMOCCULT GUIAC POC 1CARD (OFFICE): FECAL OCCULT BLD: NEGATIVE

## 2016-08-30 NOTE — Progress Notes (Signed)
Patient ID: [name], female   DOB: [birth year], 56 y.o.   MRN: [id number] History of Present Illness: [name] is a 56 year old white female, married in for a well woman gyn exam and pap, she is sp hysterectomy for CIS. PCP is Dr Gerarda Fraction and she sees Dr Suzette Battiest.    Current Medications, Allergies, Past Medical History, Past Surgical History, Family History and Social History were reviewed in [employer] record.     Review of Systems: Patient denies any  hearing loss, fatigue, blurred vision, shortness of breath, chest pain, abdominal pain, problems with bowel movements, or intercourse. No joint pain or mood swings.Has burning with urination and some hot flashes. Has had headache, had MRI on head Friday to rule out trigeminal neuralgia.   Physical Exam:BP (!) 170/90 (BP Location: Right Arm, Patient Position: Sitting, Cuff Size: Normal)   Pulse 72   Ht 5\' 5"  (1.651 m)   Wt 148 lb 8 oz (67.4 kg)   BMI 24.71 kg/m  General:  Well developed, well nourished, no acute distress Skin:  Warm and dry, she had face peel recently by Dr Tarri Glenn,  Neck:  Midline trachea, normal thyroid, good ROM, no lymphadenopathy Lungs; Clear to auscultation bilaterally Breast:  No dominant palpable mass, retraction, or nipple discharge Cardiovascular: Regular rate and rhythm Abdomen:  Soft, non tender, no hepatosplenomegaly Pelvic:  External genitalia is normal in appearance, has stitiches on mons pubis where mole removed.  The vagina is normal in appearance. Urethra has no lesions or masses. The cervix and uterus are absent, pap with HPV performed.  No adnexal masses or tenderness noted.Bladder is non tender, no masses felt. Rectal: Good sphincter tone, no polyps, or hemorrhoids felt.  Hemoccult negative. Extremities/musculoskeletal:  No swelling or varicosities noted, no clubbing or cyanosis Psych:  No mood changes, alert and cooperative,seems happy PHQ 2 score 0.Discussed trying SSRI for hot  flashes, declines for now.   Impression: 1. Well woman exam with routine gynecological exam   2. Burning with urination   3. Essential hypertension   4. Hot flashes   5. Hematuria, unspecified type   6. Vaginal Pap smear following hysterectomy for malignancy   7. Screening for colorectal cancer       Plan: UA C&S sent Physical in 1 year Mammogram yearly F/u with Dr Gerarda Fraction on BP and MRI results

## 2016-08-31 LAB — URINALYSIS, ROUTINE W REFLEX MICROSCOPIC
Bilirubin, UA: NEGATIVE
Glucose, UA: NEGATIVE
Ketones, UA: NEGATIVE
Leukocytes, UA: NEGATIVE
Nitrite, UA: NEGATIVE
PH UA: 6.5 (ref 5.0–7.5)
PROTEIN UA: NEGATIVE
RBC UA: NEGATIVE
Specific Gravity, UA: 1.019 (ref 1.005–1.030)
UUROB: 0.2 mg/dL (ref 0.2–1.0)

## 2016-08-31 LAB — CYTOLOGY - PAP
Diagnosis: NEGATIVE
HPV (WINDOPATH): NOT DETECTED

## 2016-09-01 LAB — URINE CULTURE

## 2016-09-13 ENCOUNTER — Telehealth: Payer: Self-pay | Admitting: Adult Health

## 2016-09-14 NOTE — Telephone Encounter (Signed)
Please call patient tomorrow after 10am regarding urine results. (call shop)

## 2016-09-15 MED ORDER — HYDROCORTISONE ACETATE 25 MG RE SUPP: 25.0000 mg | Freq: Two times a day (BID) | RECTAL | 0 refills | Status: DC

## 2016-09-15 NOTE — Telephone Encounter (Signed)
Pt aware urine negative and no growth on culture and requests refill on Anucort HC 25 mg supp

## 2016-09-23 IMAGING — DX DG CHEST 2V
2 series · 2 of 2 positions shown · non-contrast
Comparison: Chest radiograph performed 11/24/2009

CLINICAL DATA: Acute onset of epigastric and chest pain. Initial
encounter.

EXAM:
CHEST  2 VIEW

[chest pa]
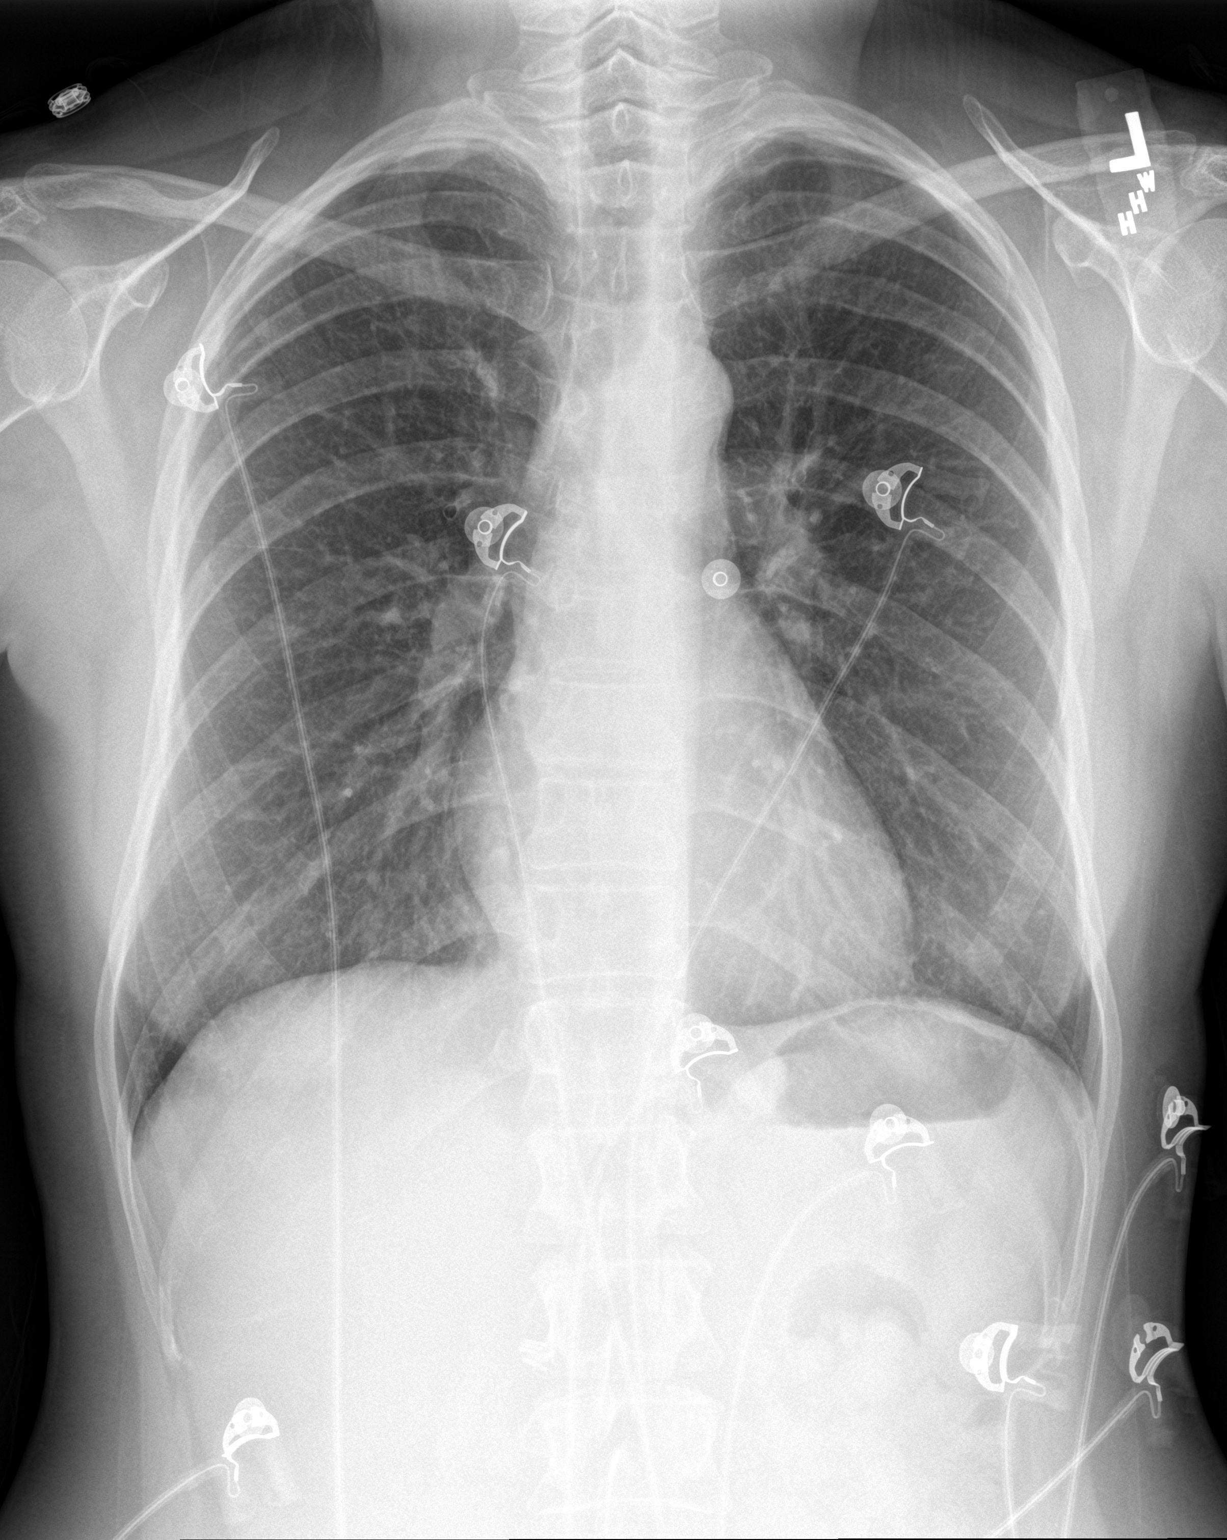

[chest lat]
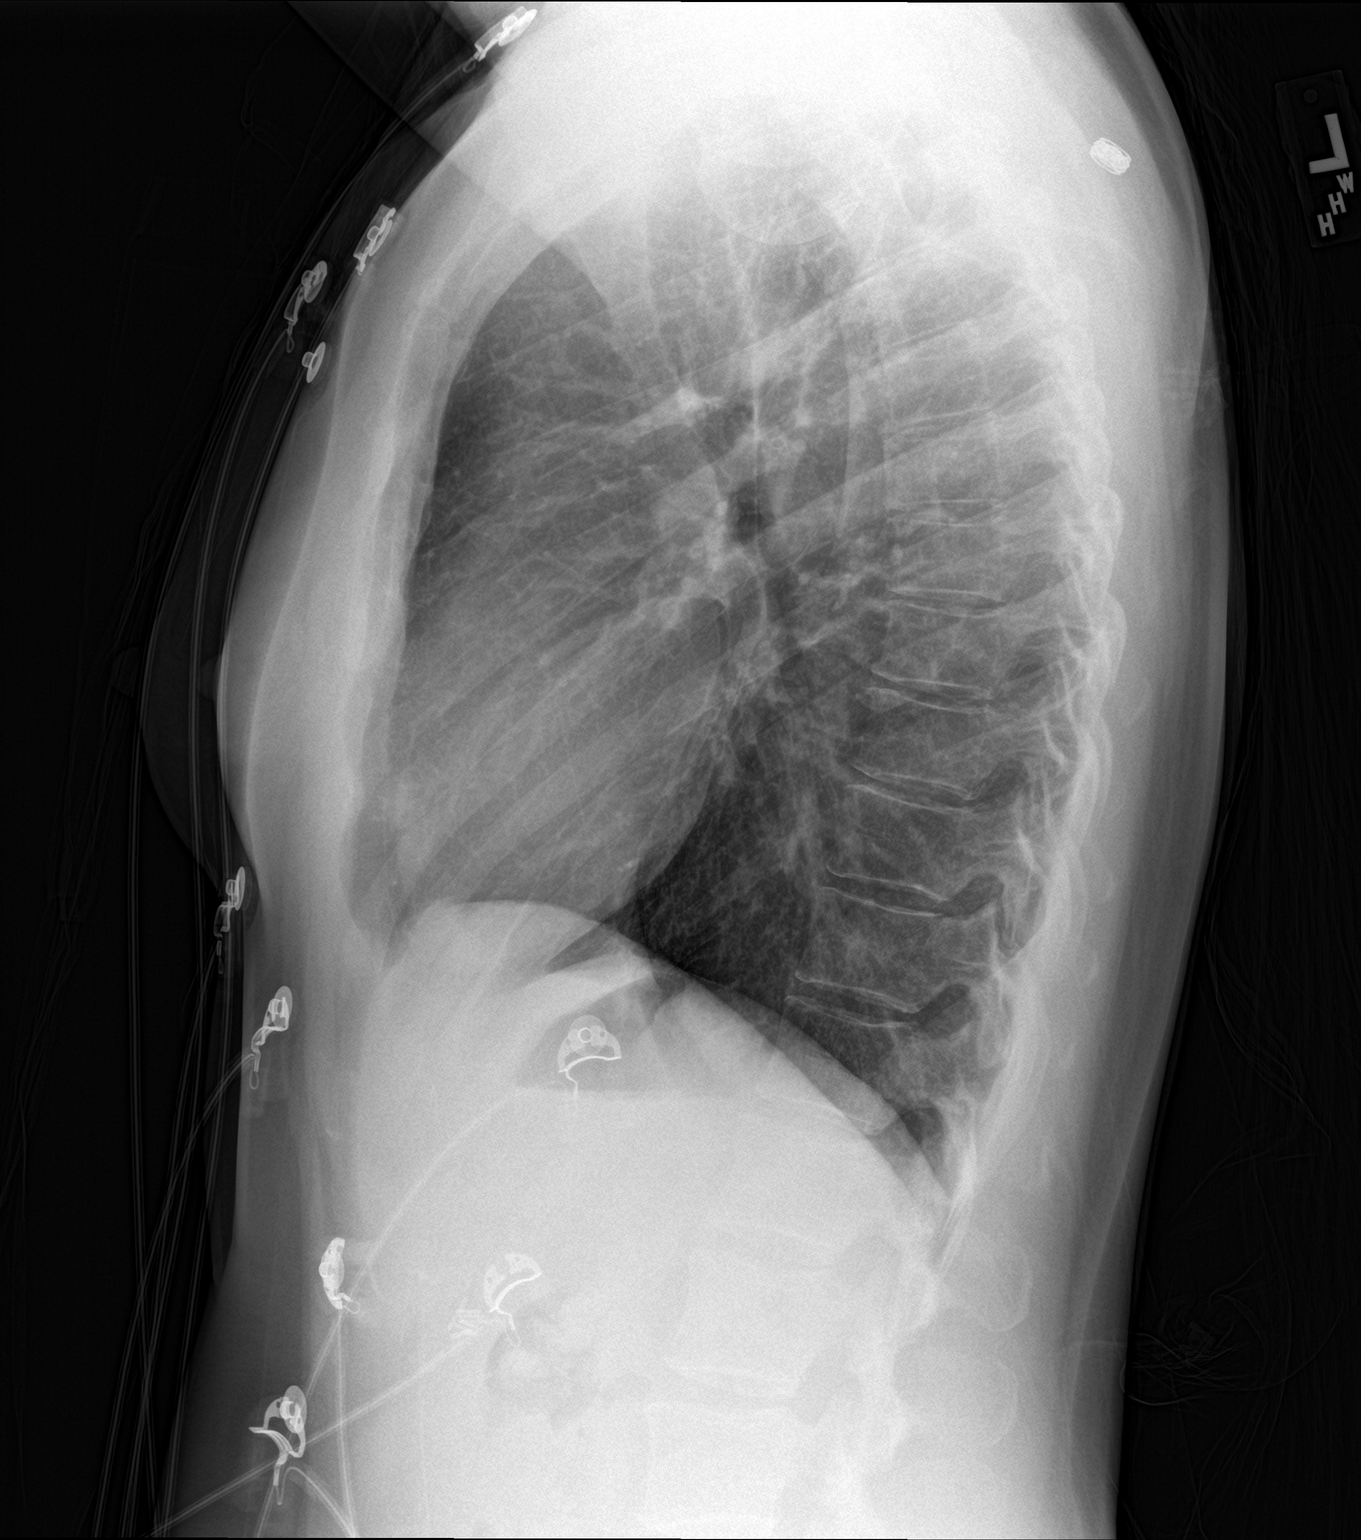

[2 of 2 positions shown; findings below may reference images not displayed]

FINDINGS: The lungs are well-aerated and clear. There is no evidence of focal
opacification, pleural effusion or pneumothorax.

The heart is normal in size; the mediastinal contour is within
normal limits. No acute osseous abnormalities are seen.
IMPRESSION: No acute cardiopulmonary process seen.

## 2016-10-08 DIAGNOSIS — Z981 Arthrodesis status: Secondary | ICD-10-CM | POA: Diagnosis not present

## 2016-10-08 DIAGNOSIS — M5440 Lumbago with sciatica, unspecified side: Secondary | ICD-10-CM | POA: Diagnosis not present

## 2016-10-08 DIAGNOSIS — M546 Pain in thoracic spine: Secondary | ICD-10-CM | POA: Diagnosis not present

## 2016-10-08 DIAGNOSIS — M5416 Radiculopathy, lumbar region: Secondary | ICD-10-CM | POA: Diagnosis not present

## 2016-10-08 DIAGNOSIS — M549 Dorsalgia, unspecified: Secondary | ICD-10-CM | POA: Diagnosis not present

## 2016-10-27 ENCOUNTER — Other Ambulatory Visit: Payer: Self-pay | Admitting: Neurosurgery

## 2016-10-27 DIAGNOSIS — M5440 Lumbago with sciatica, unspecified side: Secondary | ICD-10-CM

## 2016-11-10 ENCOUNTER — Ambulatory Visit
Admission: RE | Admit: 2016-11-10 | Discharge: 2016-11-10 | Disposition: A | Discharge status: Home / Self Care | Payer: PRIVATE HEALTH INSURANCE | Source: Ambulatory Visit | Attending: Neurosurgery | Admitting: Neurosurgery

## 2016-11-10 DIAGNOSIS — M5440 Lumbago with sciatica, unspecified side: Secondary | ICD-10-CM

## 2016-11-10 DIAGNOSIS — M48061 Spinal stenosis, lumbar region without neurogenic claudication: Secondary | ICD-10-CM | POA: Diagnosis not present

## 2016-11-10 MED ORDER — GADOBENATE DIMEGLUMINE 529 MG/ML IV SOLN
13.0000 mL | Freq: Once | INTRAVENOUS | Status: AC | PRN
  Administered 2016-11-10: 13 mL via INTRAVENOUS

## 2016-11-12 DIAGNOSIS — M48062 Spinal stenosis, lumbar region with neurogenic claudication: Secondary | ICD-10-CM | POA: Diagnosis not present

## 2016-11-12 DIAGNOSIS — M5136 Other intervertebral disc degeneration, lumbar region: Secondary | ICD-10-CM | POA: Diagnosis not present

## 2016-11-12 DIAGNOSIS — I10 Essential (primary) hypertension: Secondary | ICD-10-CM | POA: Diagnosis not present

## 2016-11-12 DIAGNOSIS — M5126 Other intervertebral disc displacement, lumbar region: Secondary | ICD-10-CM | POA: Diagnosis not present

## 2016-11-12 DIAGNOSIS — E890 Postprocedural hypothyroidism: Secondary | ICD-10-CM | POA: Diagnosis not present

## 2016-11-12 DIAGNOSIS — M4726 Other spondylosis with radiculopathy, lumbar region: Secondary | ICD-10-CM | POA: Diagnosis not present

## 2016-11-16 DIAGNOSIS — I10 Essential (primary) hypertension: Secondary | ICD-10-CM | POA: Diagnosis not present

## 2016-11-16 DIAGNOSIS — E890 Postprocedural hypothyroidism: Secondary | ICD-10-CM | POA: Diagnosis not present

## 2016-11-16 DIAGNOSIS — E049 Nontoxic goiter, unspecified: Secondary | ICD-10-CM | POA: Diagnosis not present

## 2016-11-17 DIAGNOSIS — M5136 Other intervertebral disc degeneration, lumbar region: Secondary | ICD-10-CM | POA: Diagnosis not present

## 2016-11-17 DIAGNOSIS — M5116 Intervertebral disc disorders with radiculopathy, lumbar region: Secondary | ICD-10-CM | POA: Diagnosis not present

## 2016-11-17 DIAGNOSIS — M48061 Spinal stenosis, lumbar region without neurogenic claudication: Secondary | ICD-10-CM | POA: Diagnosis not present

## 2016-11-17 DIAGNOSIS — M4726 Other spondylosis with radiculopathy, lumbar region: Secondary | ICD-10-CM | POA: Diagnosis not present

## 2017-01-11 DIAGNOSIS — L821 Other seborrheic keratosis: Secondary | ICD-10-CM | POA: Diagnosis not present

## 2017-01-11 DIAGNOSIS — L570 Actinic keratosis: Secondary | ICD-10-CM | POA: Diagnosis not present

## 2017-01-14 DIAGNOSIS — M5126 Other intervertebral disc displacement, lumbar region: Secondary | ICD-10-CM | POA: Diagnosis not present

## 2017-01-14 DIAGNOSIS — M48062 Spinal stenosis, lumbar region with neurogenic claudication: Secondary | ICD-10-CM | POA: Diagnosis not present

## 2017-01-14 DIAGNOSIS — M5136 Other intervertebral disc degeneration, lumbar region: Secondary | ICD-10-CM | POA: Diagnosis not present

## 2017-01-14 DIAGNOSIS — M4726 Other spondylosis with radiculopathy, lumbar region: Secondary | ICD-10-CM | POA: Diagnosis not present

## 2017-02-03 DIAGNOSIS — M5136 Other intervertebral disc degeneration, lumbar region: Secondary | ICD-10-CM | POA: Diagnosis not present

## 2017-02-03 DIAGNOSIS — M4726 Other spondylosis with radiculopathy, lumbar region: Secondary | ICD-10-CM | POA: Diagnosis not present

## 2017-02-03 DIAGNOSIS — M48061 Spinal stenosis, lumbar region without neurogenic claudication: Secondary | ICD-10-CM | POA: Diagnosis not present

## 2017-03-11 DIAGNOSIS — E890 Postprocedural hypothyroidism: Secondary | ICD-10-CM | POA: Diagnosis not present

## 2017-05-26 DIAGNOSIS — M48061 Spinal stenosis, lumbar region without neurogenic claudication: Secondary | ICD-10-CM | POA: Diagnosis not present

## 2017-05-26 DIAGNOSIS — M5136 Other intervertebral disc degeneration, lumbar region: Secondary | ICD-10-CM | POA: Diagnosis not present

## 2017-05-26 DIAGNOSIS — M4726 Other spondylosis with radiculopathy, lumbar region: Secondary | ICD-10-CM | POA: Diagnosis not present

## 2017-06-08 ENCOUNTER — Other Ambulatory Visit: Payer: Self-pay | Admitting: Adult Health

## 2017-06-08 DIAGNOSIS — Z1231 Encounter for screening mammogram for malignant neoplasm of breast: Secondary | ICD-10-CM

## 2017-06-10 ENCOUNTER — Ambulatory Visit (HOSPITAL_COMMUNITY)
Admission: RE | Admit: 2017-06-10 | Discharge: 2017-06-10 | Disposition: A | Discharge status: Home / Self Care | Payer: BLUE CROSS/BLUE SHIELD | Source: Ambulatory Visit | Attending: Adult Health | Admitting: Adult Health

## 2017-06-10 DIAGNOSIS — Z1231 Encounter for screening mammogram for malignant neoplasm of breast: Secondary | ICD-10-CM | POA: Insufficient documentation

## 2017-06-13 DIAGNOSIS — L570 Actinic keratosis: Secondary | ICD-10-CM | POA: Diagnosis not present

## 2017-06-13 DIAGNOSIS — Z85828 Personal history of other malignant neoplasm of skin: Secondary | ICD-10-CM | POA: Diagnosis not present

## 2017-06-13 DIAGNOSIS — D485 Neoplasm of uncertain behavior of skin: Secondary | ICD-10-CM | POA: Diagnosis not present

## 2017-06-23 DIAGNOSIS — D485 Neoplasm of uncertain behavior of skin: Secondary | ICD-10-CM | POA: Diagnosis not present

## 2017-08-23 DIAGNOSIS — E890 Postprocedural hypothyroidism: Secondary | ICD-10-CM | POA: Diagnosis not present

## 2017-08-26 DIAGNOSIS — E890 Postprocedural hypothyroidism: Secondary | ICD-10-CM | POA: Diagnosis not present

## 2017-08-26 DIAGNOSIS — I10 Essential (primary) hypertension: Secondary | ICD-10-CM | POA: Diagnosis not present

## 2017-08-26 DIAGNOSIS — E042 Nontoxic multinodular goiter: Secondary | ICD-10-CM | POA: Diagnosis not present

## 2017-08-26 DIAGNOSIS — Z6827 Body mass index (BMI) 27.0-27.9, adult: Secondary | ICD-10-CM | POA: Diagnosis not present

## 2017-09-06 DIAGNOSIS — Z1389 Encounter for screening for other disorder: Secondary | ICD-10-CM | POA: Diagnosis not present

## 2017-09-06 DIAGNOSIS — J019 Acute sinusitis, unspecified: Secondary | ICD-10-CM | POA: Diagnosis not present

## 2017-09-06 DIAGNOSIS — Z6826 Body mass index (BMI) 26.0-26.9, adult: Secondary | ICD-10-CM | POA: Diagnosis not present

## 2017-09-06 DIAGNOSIS — R05 Cough: Secondary | ICD-10-CM | POA: Diagnosis not present

## 2017-09-06 DIAGNOSIS — E663 Overweight: Secondary | ICD-10-CM | POA: Diagnosis not present

## 2017-09-06 DIAGNOSIS — R509 Fever, unspecified: Secondary | ICD-10-CM | POA: Diagnosis not present

## 2017-09-06 DIAGNOSIS — R0981 Nasal congestion: Secondary | ICD-10-CM | POA: Diagnosis not present

## 2017-09-13 DIAGNOSIS — M5440 Lumbago with sciatica, unspecified side: Secondary | ICD-10-CM | POA: Diagnosis not present

## 2017-09-13 DIAGNOSIS — Z981 Arthrodesis status: Secondary | ICD-10-CM | POA: Diagnosis not present

## 2017-09-13 DIAGNOSIS — M5126 Other intervertebral disc displacement, lumbar region: Secondary | ICD-10-CM | POA: Diagnosis not present

## 2017-09-13 DIAGNOSIS — M5416 Radiculopathy, lumbar region: Secondary | ICD-10-CM | POA: Diagnosis not present

## 2017-09-29 DIAGNOSIS — E890 Postprocedural hypothyroidism: Secondary | ICD-10-CM | POA: Diagnosis not present

## 2017-09-29 DIAGNOSIS — Z6827 Body mass index (BMI) 27.0-27.9, adult: Secondary | ICD-10-CM | POA: Diagnosis not present

## 2017-09-29 DIAGNOSIS — E042 Nontoxic multinodular goiter: Secondary | ICD-10-CM | POA: Diagnosis not present

## 2017-09-29 DIAGNOSIS — I10 Essential (primary) hypertension: Secondary | ICD-10-CM | POA: Diagnosis not present

## 2017-11-21 DIAGNOSIS — L309 Dermatitis, unspecified: Secondary | ICD-10-CM | POA: Diagnosis not present

## 2017-11-21 DIAGNOSIS — Z6824 Body mass index (BMI) 24.0-24.9, adult: Secondary | ICD-10-CM | POA: Diagnosis not present

## 2017-11-21 DIAGNOSIS — Z1389 Encounter for screening for other disorder: Secondary | ICD-10-CM | POA: Diagnosis not present

## 2017-12-02 DIAGNOSIS — Z23 Encounter for immunization: Secondary | ICD-10-CM | POA: Diagnosis not present

## 2017-12-15 DIAGNOSIS — I10 Essential (primary) hypertension: Secondary | ICD-10-CM | POA: Diagnosis not present

## 2017-12-23 DIAGNOSIS — I10 Essential (primary) hypertension: Secondary | ICD-10-CM | POA: Diagnosis not present

## 2017-12-23 DIAGNOSIS — E890 Postprocedural hypothyroidism: Secondary | ICD-10-CM | POA: Diagnosis not present

## 2017-12-23 DIAGNOSIS — E049 Nontoxic goiter, unspecified: Secondary | ICD-10-CM | POA: Diagnosis not present

## 2018-02-22 DIAGNOSIS — E890 Postprocedural hypothyroidism: Secondary | ICD-10-CM | POA: Diagnosis not present

## 2018-03-17 DIAGNOSIS — M5136 Other intervertebral disc degeneration, lumbar region: Secondary | ICD-10-CM | POA: Diagnosis not present

## 2018-03-17 DIAGNOSIS — M4726 Other spondylosis with radiculopathy, lumbar region: Secondary | ICD-10-CM | POA: Diagnosis not present

## 2018-03-17 DIAGNOSIS — Z981 Arthrodesis status: Secondary | ICD-10-CM | POA: Diagnosis not present

## 2018-03-17 DIAGNOSIS — M48062 Spinal stenosis, lumbar region with neurogenic claudication: Secondary | ICD-10-CM | POA: Diagnosis not present

## 2018-03-22 ENCOUNTER — Other Ambulatory Visit: Payer: Self-pay | Admitting: Neurosurgery

## 2018-03-22 DIAGNOSIS — M4726 Other spondylosis with radiculopathy, lumbar region: Secondary | ICD-10-CM

## 2018-03-29 DIAGNOSIS — I10 Essential (primary) hypertension: Secondary | ICD-10-CM | POA: Diagnosis not present

## 2018-03-29 DIAGNOSIS — Z6824 Body mass index (BMI) 24.0-24.9, adult: Secondary | ICD-10-CM | POA: Diagnosis not present

## 2018-04-01 ENCOUNTER — Ambulatory Visit
Admission: RE | Admit: 2018-04-01 | Discharge: 2018-04-01 | Disposition: A | Discharge status: Home / Self Care | Payer: BLUE CROSS/BLUE SHIELD | Source: Ambulatory Visit | Attending: Neurosurgery | Admitting: Neurosurgery

## 2018-04-01 DIAGNOSIS — M4726 Other spondylosis with radiculopathy, lumbar region: Secondary | ICD-10-CM | POA: Diagnosis not present

## 2018-04-01 MED ORDER — GADOBENATE DIMEGLUMINE 529 MG/ML IV SOLN
13.0000 mL | Freq: Once | INTRAVENOUS | Status: AC | PRN
  Administered 2018-04-01: 13 mL via INTRAVENOUS

## 2018-04-06 ENCOUNTER — Other Ambulatory Visit: Payer: Self-pay | Admitting: Neurosurgery

## 2018-04-06 DIAGNOSIS — M48062 Spinal stenosis, lumbar region with neurogenic claudication: Secondary | ICD-10-CM | POA: Diagnosis not present

## 2018-04-06 DIAGNOSIS — Z981 Arthrodesis status: Secondary | ICD-10-CM | POA: Diagnosis not present

## 2018-04-06 DIAGNOSIS — M5416 Radiculopathy, lumbar region: Secondary | ICD-10-CM | POA: Diagnosis not present

## 2018-04-06 DIAGNOSIS — M4726 Other spondylosis with radiculopathy, lumbar region: Secondary | ICD-10-CM | POA: Diagnosis not present

## 2018-04-14 DIAGNOSIS — J069 Acute upper respiratory infection, unspecified: Secondary | ICD-10-CM | POA: Diagnosis not present

## 2018-04-14 DIAGNOSIS — Z23 Encounter for immunization: Secondary | ICD-10-CM | POA: Diagnosis not present

## 2018-04-14 DIAGNOSIS — Z6825 Body mass index (BMI) 25.0-25.9, adult: Secondary | ICD-10-CM | POA: Diagnosis not present

## 2018-04-14 DIAGNOSIS — D510 Vitamin B12 deficiency anemia due to intrinsic factor deficiency: Secondary | ICD-10-CM | POA: Diagnosis not present

## 2018-04-14 DIAGNOSIS — E663 Overweight: Secondary | ICD-10-CM | POA: Diagnosis not present

## 2018-04-25 ENCOUNTER — Other Ambulatory Visit: Payer: Self-pay | Admitting: Neurosurgery

## 2018-05-05 NOTE — Pre-Procedure Instructions (Signed)
BEATRIS BELEN  05/05/2018      [facility] - [place], [address] [place] [address] Phone: [phone] Fax: [phone]    Your procedure is scheduled on May 15, 2018.  Report to [facility] Admitting at 530 AM.  Call this number if you have problems the morning of surgery:  [phone]   Remember:  Do not eat or drink after midnight.    Take these medicines the morning of surgery with A SIP OF WATER  Metoprolol tartrate (lopressor) Levothyroxine (synthroid) Singulair-if needed Albuterol inhaler-if needed-bring inhaler with you   7 days prior to surgery STOP taking any Aspirin (unless otherwise instructed by your surgeon), Aleve, Naproxen, Ibuprofen, Motrin, Advil, Goody's, BC's, all herbal medications, fish oil, and all vitamins    Do not wear jewelry, make-up or nail polish.  Do not wear lotions, powders, or perfumes, or deodorant.  Do not shave 48 hours prior to surgery.   Do not bring valuables to the hospital.   [facility] is not responsible for any belongings or valuables.  Contacts, dentures or bridgework may not be worn into surgery.  Leave your suitcase in the car.  After surgery it may be brought to your room.  For patients admitted to the hospital, discharge time will be determined by your treatment team.  Patients discharged the day of surgery will not be allowed to drive home.   [facility]- Preparing For Surgery  Before surgery, you can play an important role. Because skin is not sterile, your skin needs to be as free of germs as possible. You can reduce the number of germs on your skin by washing with CHG (chlorahexidine gluconate) Soap before surgery.  CHG is an antiseptic cleaner which kills germs and bonds with the skin to continue killing germs even after washing.    Oral Hygiene is also important to reduce your risk of infection.  Remember - BRUSH YOUR TEETH THE MORNING OF SURGERY WITH YOUR  REGULAR TOOTHPASTE  Please do not use if you have an allergy to CHG or antibacterial soaps. If your skin becomes reddened/irritated stop using the CHG.  Do not shave (including legs and underarms) for at least 48 hours prior to first CHG shower. It is OK to shave your face.  Please follow these instructions carefully.   1. Shower the NIGHT BEFORE SURGERY and the MORNING OF SURGERY with CHG.   2. If you chose to wash your hair, wash your hair first as usual with your normal shampoo.  3. After you shampoo, rinse your hair and body thoroughly to remove the shampoo.  4. Use CHG as you would any other liquid soap. You can apply CHG directly to the skin and wash gently with a scrungie or a clean washcloth.   5. Apply the CHG Soap to your body ONLY FROM THE NECK DOWN.  Do not use on open wounds or open sores. Avoid contact with your eyes, ears, mouth and genitals (private parts). Wash Face and genitals (private parts)  with your normal soap.  6. Wash thoroughly, paying special attention to the area where your surgery will be performed.  7. Thoroughly rinse your body with warm water from the neck down.  8. DO NOT shower/wash with your normal soap after using and rinsing off the CHG Soap.  9. Pat yourself dry with a CLEAN TOWEL.  10. Wear CLEAN PAJAMAS to bed the night before surgery, wear comfortable  clothes the morning of surgery  11. Place CLEAN SHEETS on your bed the night of your first shower and DO NOT SLEEP WITH PETS.  Day of Surgery:  Do not apply any deodorants/lotions.  Please wear clean clothes to the hospital/surgery center.   Remember to brush your teeth WITH YOUR REGULAR TOOTHPASTE.   Please read over the fact sheets that you were given.

## 2018-05-08 ENCOUNTER — Other Ambulatory Visit: Payer: Self-pay

## 2018-05-08 ENCOUNTER — Encounter (HOSPITAL_COMMUNITY): Payer: Self-pay

## 2018-05-08 ENCOUNTER — Encounter (HOSPITAL_COMMUNITY)
Admission: RE | Admit: 2018-05-08 | Discharge: 2018-05-08 | Disposition: A | Discharge status: Home / Self Care | Payer: BLUE CROSS/BLUE SHIELD | Source: Ambulatory Visit | Attending: Neurosurgery | Admitting: Neurosurgery

## 2018-05-08 DIAGNOSIS — Z01812 Encounter for preprocedural laboratory examination: Secondary | ICD-10-CM | POA: Insufficient documentation

## 2018-05-08 LAB — BASIC METABOLIC PANEL
ANION GAP: 8 (ref 5–15)
BUN: 21 mg/dL — ABNORMAL HIGH (ref 6–20)
CHLORIDE: 107 mmol/L (ref 98–111)
CO2: 26 mmol/L (ref 22–32)
Calcium: 9.4 mg/dL (ref 8.9–10.3)
Creatinine, Ser: 0.76 mg/dL (ref 0.44–1.00)
GFR calc Af Amer: >60 mL/min (ref >60)
Glucose, Bld: 94 mg/dL (ref 70–99)
Potassium: 4.1 mmol/L (ref 3.5–5.1)
Sodium: 141 mmol/L (ref 135–145)

## 2018-05-08 LAB — CBC
HEMATOCRIT: 48.9 % — AB (ref 36.0–46.0)
HEMOGLOBIN: 15.3 g/dL — AB (ref 12.0–15.0)
MCH: 29.3 pg (ref 26.0–34.0)
MCHC: 31.3 g/dL (ref 30.0–36.0)
MCV: 93.5 fL (ref 80.0–100.0)
NRBC: 0 % (ref 0.0–0.2)
Platelets: 194 10*3/uL (ref 150–400)
RBC: 5.23 MIL/uL — ABNORMAL HIGH (ref 3.87–5.11)
RDW: 11.5 % (ref 11.5–15.5)
WBC: 4.4 10*3/uL (ref 4.0–10.5)

## 2018-05-08 LAB — TYPE AND SCREEN
ABO/RH(D): O NEG
Antibody Screen: NEGATIVE

## 2018-05-08 LAB — SURGICAL PCR SCREEN
MRSA, PCR: NEGATIVE
Staphylococcus aureus: NEGATIVE

## 2018-05-08 NOTE — Progress Notes (Addendum)
PCP:  Sharilyn Sites, MD  Cardiologist:  EKG: 05/08/18 in [facility]  Stress test: 07/2008 in [facility]-(media tab 11/27/09)  ECHO:  Pt denies  Cardiac Cath: 11/2009- in [facility] (media tab 11/26/09)-normal  Chest x-ray: pt denies past year, she was recently treated for a sinus infection-no respiratory complications

## 2018-05-15 ENCOUNTER — Inpatient Hospital Stay (HOSPITAL_COMMUNITY): Payer: BLUE CROSS/BLUE SHIELD | Admitting: Anesthesiology

## 2018-05-15 ENCOUNTER — Inpatient Hospital Stay (HOSPITAL_COMMUNITY)
Admission: RE | Disposition: A | Discharge status: Home / Self Care | Payer: Self-pay | Source: Home / Self Care | Attending: Neurosurgery

## 2018-05-15 ENCOUNTER — Encounter (HOSPITAL_COMMUNITY): Payer: Self-pay | Admitting: General Practice

## 2018-05-15 ENCOUNTER — Inpatient Hospital Stay (HOSPITAL_COMMUNITY)
Admission: RE | Admit: 2018-05-15 | Discharge: 2018-05-16 | DRG: 455 | Disposition: A | Discharge status: Home / Self Care | Payer: BLUE CROSS/BLUE SHIELD | Attending: Neurosurgery | Admitting: Neurosurgery

## 2018-05-15 ENCOUNTER — Inpatient Hospital Stay (HOSPITAL_COMMUNITY): Payer: BLUE CROSS/BLUE SHIELD

## 2018-05-15 ENCOUNTER — Other Ambulatory Visit: Payer: Self-pay

## 2018-05-15 DIAGNOSIS — E039 Hypothyroidism, unspecified: Secondary | ICD-10-CM | POA: Diagnosis not present

## 2018-05-15 DIAGNOSIS — Z79890 Hormone replacement therapy (postmenopausal): Secondary | ICD-10-CM | POA: Diagnosis not present

## 2018-05-15 DIAGNOSIS — Z981 Arthrodesis status: Secondary | ICD-10-CM | POA: Diagnosis not present

## 2018-05-15 DIAGNOSIS — M48062 Spinal stenosis, lumbar region with neurogenic claudication: Secondary | ICD-10-CM | POA: Diagnosis not present

## 2018-05-15 DIAGNOSIS — J4520 Mild intermittent asthma, uncomplicated: Secondary | ICD-10-CM | POA: Diagnosis not present

## 2018-05-15 DIAGNOSIS — M5116 Intervertebral disc disorders with radiculopathy, lumbar region: Secondary | ICD-10-CM | POA: Diagnosis not present

## 2018-05-15 DIAGNOSIS — Z419 Encounter for procedure for purposes other than remedying health state, unspecified: Secondary | ICD-10-CM

## 2018-05-15 DIAGNOSIS — Z90710 Acquired absence of both cervix and uterus: Secondary | ICD-10-CM

## 2018-05-15 DIAGNOSIS — M4726 Other spondylosis with radiculopathy, lumbar region: Secondary | ICD-10-CM | POA: Diagnosis not present

## 2018-05-15 DIAGNOSIS — I10 Essential (primary) hypertension: Secondary | ICD-10-CM | POA: Diagnosis not present

## 2018-05-15 DIAGNOSIS — Z79899 Other long term (current) drug therapy: Secondary | ICD-10-CM | POA: Diagnosis not present

## 2018-05-15 SURGERY — POSTERIOR LUMBAR FUSION 1 WITH HARDWARE REMOVAL
Anesthesia: General | Site: Back

## 2018-05-15 MED ORDER — ACETAMINOPHEN 10 MG/ML IV SOLN
INTRAVENOUS | Status: AC
  Filled 2018-05-15: qty 100

## 2018-05-15 MED ORDER — PHENOL 1.4 % MT LIQD: 1.0000 | OROMUCOSAL | Status: DC | PRN

## 2018-05-15 MED ORDER — LACTATED RINGERS IV SOLN
INTRAVENOUS | Status: DC | PRN
  Administered 2018-05-15 (×2): via INTRAVENOUS

## 2018-05-15 MED ORDER — MONTELUKAST SODIUM 10 MG PO TABS
10.0000 mg | ORAL_TABLET | Freq: Every day | ORAL | Status: DC | PRN
  Filled 2018-05-15: qty 1

## 2018-05-15 MED ORDER — METOPROLOL TARTRATE 50 MG PO TABS
50.0000 mg | ORAL_TABLET | Freq: Once | ORAL | Status: AC
  Administered 2018-05-15: 50 mg via ORAL

## 2018-05-15 MED ORDER — MIDAZOLAM HCL 5 MG/5ML IJ SOLN
INTRAMUSCULAR | Status: DC | PRN
  Administered 2018-05-15: 2 mg via INTRAVENOUS

## 2018-05-15 MED ORDER — ACETAMINOPHEN 160 MG/5ML PO SOLN: 1000.0000 mg | Freq: Once | ORAL | Status: DC | PRN

## 2018-05-15 MED ORDER — EPHEDRINE SULFATE-NACL 50-0.9 MG/10ML-% IV SOSY
PREFILLED_SYRINGE | INTRAVENOUS | Status: DC | PRN
  Administered 2018-05-15 (×8): 10 mg via INTRAVENOUS

## 2018-05-15 MED ORDER — METOPROLOL TARTRATE 50 MG PO TABS
ORAL_TABLET | ORAL | Status: AC
  Filled 2018-05-15: qty 1

## 2018-05-15 MED ORDER — ACETAMINOPHEN 500 MG PO TABS: 1000.0000 mg | ORAL_TABLET | Freq: Once | ORAL | Status: DC | PRN

## 2018-05-15 MED ORDER — HYDROCODONE-ACETAMINOPHEN 5-325 MG PO TABS
1.0000 | ORAL_TABLET | ORAL | Status: DC | PRN
  Administered 2018-05-15 – 2018-05-16 (×3): 1 via ORAL
  Filled 2018-05-15 (×3): qty 1

## 2018-05-15 MED ORDER — KCL IN DEXTROSE-NACL 20-5-0.45 MEQ/L-%-% IV SOLN
INTRAVENOUS | Status: DC
  Administered 2018-05-15: 18:00:00 via INTRAVENOUS
  Filled 2018-05-15: qty 1000

## 2018-05-15 MED ORDER — ACETAMINOPHEN 325 MG PO TABS: 650.0000 mg | ORAL_TABLET | ORAL | Status: DC | PRN

## 2018-05-15 MED ORDER — DILTIAZEM HCL ER COATED BEADS 240 MG PO CP24
240.0000 mg | ORAL_CAPSULE | Freq: Every day | ORAL | Status: DC
  Filled 2018-05-15 (×2): qty 1

## 2018-05-15 MED ORDER — LIDOCAINE-EPINEPHRINE 1 %-1:100000 IJ SOLN
INTRAMUSCULAR | Status: DC | PRN
  Administered 2018-05-15: 15 mL

## 2018-05-15 MED ORDER — ONDANSETRON HCL 4 MG/2ML IJ SOLN
INTRAMUSCULAR | Status: DC | PRN
  Administered 2018-05-15: 4 mg via INTRAVENOUS

## 2018-05-15 MED ORDER — BUPIVACAINE HCL (PF) 0.5 % IJ SOLN
INTRAMUSCULAR | Status: DC | PRN
  Administered 2018-05-15: 15 mL

## 2018-05-15 MED ORDER — FLEET ENEMA 7-19 GM/118ML RE ENEM: 1.0000 | ENEMA | Freq: Once | RECTAL | Status: DC | PRN

## 2018-05-15 MED ORDER — MAGNESIUM HYDROXIDE 400 MG/5ML PO SUSP: 30.0000 mL | Freq: Every day | ORAL | Status: DC | PRN

## 2018-05-15 MED ORDER — OXYCODONE HCL 5 MG/5ML PO SOLN: 5.0000 mg | Freq: Once | ORAL | Status: DC | PRN

## 2018-05-15 MED ORDER — BISACODYL 10 MG RE SUPP: 10.0000 mg | Freq: Every day | RECTAL | Status: DC | PRN

## 2018-05-15 MED ORDER — FENTANYL CITRATE (PF) 250 MCG/5ML IJ SOLN
INTRAMUSCULAR | Status: AC
  Filled 2018-05-15: qty 5

## 2018-05-15 MED ORDER — PROPOFOL 500 MG/50ML IV EMUL
INTRAVENOUS | Status: DC | PRN
  Administered 2018-05-15: 25 ug/kg/min via INTRAVENOUS

## 2018-05-15 MED ORDER — SODIUM CHLORIDE 0.9 % IV SOLN
INTRAVENOUS | Status: DC | PRN
  Administered 2018-05-15: 500 mL

## 2018-05-15 MED ORDER — ROCURONIUM BROMIDE 50 MG/5ML IV SOSY
PREFILLED_SYRINGE | INTRAVENOUS | Status: AC
  Filled 2018-05-15: qty 5

## 2018-05-15 MED ORDER — ACETAMINOPHEN 10 MG/ML IV SOLN: 1000.0000 mg | Freq: Once | INTRAVENOUS | Status: DC | PRN

## 2018-05-15 MED ORDER — THROMBIN (RECOMBINANT) 20000 UNITS EX SOLR
CUTANEOUS | Status: AC
  Filled 2018-05-15: qty 20000

## 2018-05-15 MED ORDER — PROPOFOL 10 MG/ML IV BOLUS
INTRAVENOUS | Status: AC
  Filled 2018-05-15: qty 40

## 2018-05-15 MED ORDER — SODIUM CHLORIDE 0.9 % IV SOLN: 250.0000 mL | INTRAVENOUS | Status: DC

## 2018-05-15 MED ORDER — PHENYLEPHRINE 40 MCG/ML (10ML) SYRINGE FOR IV PUSH (FOR BLOOD PRESSURE SUPPORT)
PREFILLED_SYRINGE | INTRAVENOUS | Status: DC | PRN
  Administered 2018-05-15 (×3): 40 ug via INTRAVENOUS

## 2018-05-15 MED ORDER — THROMBIN 5000 UNITS EX SOLR
CUTANEOUS | Status: AC
  Filled 2018-05-15: qty 5000

## 2018-05-15 MED ORDER — FENTANYL CITRATE (PF) 100 MCG/2ML IJ SOLN: 25.0000 ug | INTRAMUSCULAR | Status: DC | PRN

## 2018-05-15 MED ORDER — KETOROLAC TROMETHAMINE 30 MG/ML IJ SOLN
30.0000 mg | Freq: Once | INTRAMUSCULAR | Status: AC
  Administered 2018-05-15: 30 mg via INTRAVENOUS

## 2018-05-15 MED ORDER — ACETAMINOPHEN 10 MG/ML IV SOLN
INTRAVENOUS | Status: DC | PRN
  Administered 2018-05-15: 1000 mg via INTRAVENOUS

## 2018-05-15 MED ORDER — IRBESARTAN 150 MG PO TABS
150.0000 mg | ORAL_TABLET | Freq: Every day | ORAL | Status: DC
  Filled 2018-05-15: qty 1

## 2018-05-15 MED ORDER — LIDOCAINE 2% (20 MG/ML) 5 ML SYRINGE
INTRAMUSCULAR | Status: AC
  Filled 2018-05-15: qty 5

## 2018-05-15 MED ORDER — CYCLOBENZAPRINE HCL 5 MG PO TABS: 5.0000 mg | ORAL_TABLET | Freq: Three times a day (TID) | ORAL | Status: DC | PRN

## 2018-05-15 MED ORDER — DEXAMETHASONE SODIUM PHOSPHATE 10 MG/ML IJ SOLN
INTRAMUSCULAR | Status: AC
  Filled 2018-05-15: qty 1

## 2018-05-15 MED ORDER — GLYCOPYRROLATE 0.2 MG/ML IJ SOLN
INTRAMUSCULAR | Status: DC | PRN
  Administered 2018-05-15: 0.2 mg via INTRAVENOUS

## 2018-05-15 MED ORDER — KETOROLAC TROMETHAMINE 30 MG/ML IJ SOLN
30.0000 mg | Freq: Four times a day (QID) | INTRAMUSCULAR | Status: DC
  Administered 2018-05-15 – 2018-05-16 (×4): 30 mg via INTRAVENOUS
  Filled 2018-05-15 (×4): qty 1

## 2018-05-15 MED ORDER — PROPOFOL 10 MG/ML IV BOLUS
INTRAVENOUS | Status: DC | PRN
  Administered 2018-05-15: 40 mg via INTRAVENOUS
  Administered 2018-05-15: 120 mg via INTRAVENOUS

## 2018-05-15 MED ORDER — METOPROLOL TARTRATE 50 MG PO TABS
50.0000 mg | ORAL_TABLET | Freq: Two times a day (BID) | ORAL | Status: DC | PRN
  Filled 2018-05-15: qty 1

## 2018-05-15 MED ORDER — HYDROXYZINE HCL 25 MG PO TABS: 50.0000 mg | ORAL_TABLET | ORAL | Status: DC | PRN

## 2018-05-15 MED ORDER — CHLORHEXIDINE GLUCONATE CLOTH 2 % EX PADS: 6.0000 | MEDICATED_PAD | Freq: Once | CUTANEOUS | Status: DC

## 2018-05-15 MED ORDER — 0.9 % SODIUM CHLORIDE (POUR BTL) OPTIME
TOPICAL | Status: DC | PRN
  Administered 2018-05-15 (×2): 1000 mL

## 2018-05-15 MED ORDER — FENTANYL CITRATE (PF) 100 MCG/2ML IJ SOLN
INTRAMUSCULAR | Status: DC | PRN
  Administered 2018-05-15: 25 ug via INTRAVENOUS
  Administered 2018-05-15: 100 ug via INTRAVENOUS

## 2018-05-15 MED ORDER — ALUM & MAG HYDROXIDE-SIMETH 200-200-20 MG/5ML PO SUSP: 30.0000 mL | Freq: Four times a day (QID) | ORAL | Status: DC | PRN

## 2018-05-15 MED ORDER — ACETAMINOPHEN 650 MG RE SUPP: 650.0000 mg | RECTAL | Status: DC | PRN

## 2018-05-15 MED ORDER — LIDOCAINE-EPINEPHRINE 1 %-1:100000 IJ SOLN
INTRAMUSCULAR | Status: AC
  Filled 2018-05-15: qty 1

## 2018-05-15 MED ORDER — BUPIVACAINE HCL (PF) 0.5 % IJ SOLN
INTRAMUSCULAR | Status: AC
  Filled 2018-05-15: qty 30

## 2018-05-15 MED ORDER — THROMBIN 20000 UNITS EX SOLR
CUTANEOUS | Status: DC | PRN
  Administered 2018-05-15: 20 mL via TOPICAL

## 2018-05-15 MED ORDER — SODIUM CHLORIDE 0.9% FLUSH: 3.0000 mL | INTRAVENOUS | Status: DC | PRN

## 2018-05-15 MED ORDER — ONDANSETRON HCL 4 MG/2ML IJ SOLN
4.0000 mg | Freq: Four times a day (QID) | INTRAMUSCULAR | Status: DC
  Administered 2018-05-15: 4 mg via INTRAVENOUS
  Filled 2018-05-15: qty 2

## 2018-05-15 MED ORDER — SODIUM CHLORIDE 0.9% FLUSH
3.0000 mL | Freq: Two times a day (BID) | INTRAVENOUS | Status: DC
  Administered 2018-05-16: 3 mL via INTRAVENOUS

## 2018-05-15 MED ORDER — ONDANSETRON HCL 4 MG/2ML IJ SOLN: 4.0000 mg | Freq: Four times a day (QID) | INTRAMUSCULAR | Status: DC | PRN

## 2018-05-15 MED ORDER — ALBUTEROL SULFATE (2.5 MG/3ML) 0.083% IN NEBU
3.0000 mL | INHALATION_SOLUTION | RESPIRATORY_TRACT | Status: DC | PRN
  Filled 2018-05-15: qty 3

## 2018-05-15 MED ORDER — ONDANSETRON HCL 4 MG/2ML IJ SOLN
4.0000 mg | Freq: Four times a day (QID) | INTRAMUSCULAR | Status: DC | PRN
  Administered 2018-05-15 – 2018-05-16 (×2): 4 mg via INTRAVENOUS
  Filled 2018-05-15 (×2): qty 2

## 2018-05-15 MED ORDER — CEFAZOLIN SODIUM-DEXTROSE 2-4 GM/100ML-% IV SOLN
2.0000 g | INTRAVENOUS | Status: AC
  Administered 2018-05-15 (×2): 2 g via INTRAVENOUS
  Filled 2018-05-15: qty 100

## 2018-05-15 MED ORDER — DEXAMETHASONE SODIUM PHOSPHATE 4 MG/ML IJ SOLN
INTRAMUSCULAR | Status: DC | PRN
  Administered 2018-05-15: 10 mg via INTRAVENOUS

## 2018-05-15 MED ORDER — THROMBIN 5000 UNITS EX SOLR
OROMUCOSAL | Status: DC | PRN
  Administered 2018-05-15: 5 mL via TOPICAL

## 2018-05-15 MED ORDER — LIDOCAINE HCL (CARDIAC) PF 100 MG/5ML IV SOSY
PREFILLED_SYRINGE | INTRAVENOUS | Status: DC | PRN
  Administered 2018-05-15: 60 mg via INTRAVENOUS

## 2018-05-15 MED ORDER — OXYCODONE HCL 5 MG PO TABS: 5.0000 mg | ORAL_TABLET | Freq: Once | ORAL | Status: DC | PRN

## 2018-05-15 MED ORDER — ROCURONIUM BROMIDE 100 MG/10ML IV SOLN
INTRAVENOUS | Status: DC | PRN
  Administered 2018-05-15: 50 mg via INTRAVENOUS

## 2018-05-15 MED ORDER — MIDAZOLAM HCL 2 MG/2ML IJ SOLN
INTRAMUSCULAR | Status: AC
  Filled 2018-05-15: qty 2

## 2018-05-15 MED ORDER — EPHEDRINE 5 MG/ML INJ
INTRAVENOUS | Status: AC
  Filled 2018-05-15: qty 20

## 2018-05-15 MED ORDER — LEVOTHYROXINE SODIUM 75 MCG PO TABS
75.0000 ug | ORAL_TABLET | Freq: Every day | ORAL | Status: DC
  Administered 2018-05-16: 75 ug via ORAL
  Filled 2018-05-15: qty 1

## 2018-05-15 MED ORDER — HYDROXYZINE HCL 50 MG/ML IM SOLN
50.0000 mg | INTRAMUSCULAR | Status: DC | PRN
  Administered 2018-05-15: 50 mg via INTRAMUSCULAR
  Filled 2018-05-15: qty 1

## 2018-05-15 MED ORDER — MENTHOL 3 MG MT LOZG: 1.0000 | LOZENGE | OROMUCOSAL | Status: DC | PRN

## 2018-05-15 MED ORDER — ONDANSETRON HCL 4 MG/2ML IJ SOLN
INTRAMUSCULAR | Status: AC
  Filled 2018-05-15: qty 2

## 2018-05-15 MED ORDER — KETOROLAC TROMETHAMINE 30 MG/ML IJ SOLN
INTRAMUSCULAR | Status: AC
  Filled 2018-05-15: qty 1

## 2018-05-15 SURGICAL SUPPLY — 83 items
BAG DECANTER FOR FLEXI CONT (MISCELLANEOUS) ×2 IMPLANT
BLADE CLIPPER SURG (BLADE) IMPLANT
BUR ACRON 5.0MM COATED (BURR) ×2 IMPLANT
BUR MATCHSTICK NEURO 3.0 LAGG (BURR) ×2 IMPLANT
CAGE LUM TRIT 9X23X12 6D (Cage) ×4 IMPLANT
CANISTER SUCT 3000ML PPV (MISCELLANEOUS) ×2 IMPLANT
CAP LCK SPNE (Orthopedic Implant) ×4 IMPLANT
CAP LOCK SPINE RADIUS (Orthopedic Implant) ×4 IMPLANT
CAP LOCKING (Orthopedic Implant) ×8 IMPLANT
CARTRIDGE OIL MAESTRO DRILL (MISCELLANEOUS) ×1 IMPLANT
CONT SPEC 4OZ CLIKSEAL STRL BL (MISCELLANEOUS) ×2 IMPLANT
COVER BACK TABLE 24X17X13 BIG (DRAPES) IMPLANT
COVER BACK TABLE 60X90IN (DRAPES) ×2 IMPLANT
COVER MAYO STAND STRL (DRAPES) ×2 IMPLANT
COVER WAND RF STERILE (DRAPES) ×2 IMPLANT
DECANTER SPIKE VIAL GLASS SM (MISCELLANEOUS) ×2 IMPLANT
DERMABOND ADVANCED (GAUZE/BANDAGES/DRESSINGS) ×2
DERMABOND ADVANCED .7 DNX12 (GAUZE/BANDAGES/DRESSINGS) ×2 IMPLANT
DIFFUSER DRILL AIR PNEUMATIC (MISCELLANEOUS) ×2 IMPLANT
DRAPE C-ARM 42X72 X-RAY (DRAPES) ×2 IMPLANT
DRAPE C-ARMOR (DRAPES) ×2 IMPLANT
DRAPE HALF SHEET 40X57 (DRAPES) ×6 IMPLANT
DRAPE LAPAROTOMY 100X72X124 (DRAPES) ×2 IMPLANT
DRAPE POUCH INSTRU U-SHP 10X18 (DRAPES) ×2 IMPLANT
ELECT REM PT RETURN 9FT ADLT (ELECTROSURGICAL) ×2
ELECTRODE REM PT RTRN 9FT ADLT (ELECTROSURGICAL) ×1 IMPLANT
GAUZE 4X4 16PLY RFD (DISPOSABLE) IMPLANT
GAUZE SPONGE 4X4 12PLY STRL (GAUZE/BANDAGES/DRESSINGS) ×2 IMPLANT
GAUZE SPONGE 4X4 12PLY STRL LF (GAUZE/BANDAGES/DRESSINGS) ×2 IMPLANT
GLOVE BIO SURGEON STRL SZ7.5 (GLOVE) ×4 IMPLANT
GLOVE BIO SURGEON STRL SZ8 (GLOVE) ×2 IMPLANT
GLOVE BIO SURGEON STRL SZ8.5 (GLOVE) ×2 IMPLANT
GLOVE BIOGEL PI IND STRL 6.5 (GLOVE) ×2 IMPLANT
GLOVE BIOGEL PI IND STRL 7.0 (GLOVE) ×3 IMPLANT
GLOVE BIOGEL PI IND STRL 8 (GLOVE) ×2 IMPLANT
GLOVE BIOGEL PI INDICATOR 6.5 (GLOVE) ×2
GLOVE BIOGEL PI INDICATOR 7.0 (GLOVE) ×3
GLOVE BIOGEL PI INDICATOR 8 (GLOVE) ×2
GLOVE ECLIPSE 7.5 STRL STRAW (GLOVE) ×4 IMPLANT
GLOVE EXAM NITRILE LRG STRL (GLOVE) IMPLANT
GLOVE EXAM NITRILE XL STR (GLOVE) IMPLANT
GLOVE EXAM NITRILE XS STR PU (GLOVE) IMPLANT
GLOVE SS BIOGEL STRL SZ 6.5 (GLOVE) ×3 IMPLANT
GLOVE SUPERSENSE BIOGEL SZ 6.5 (GLOVE) ×3
GLOVE SURG SS PI 6.5 STRL IVOR (GLOVE) ×10 IMPLANT
GOWN STRL REUS W/ TWL LRG LVL3 (GOWN DISPOSABLE) ×4 IMPLANT
GOWN STRL REUS W/ TWL XL LVL3 (GOWN DISPOSABLE) ×3 IMPLANT
GOWN STRL REUS W/TWL 2XL LVL3 (GOWN DISPOSABLE) IMPLANT
GOWN STRL REUS W/TWL LRG LVL3 (GOWN DISPOSABLE) ×8
GOWN STRL REUS W/TWL XL LVL3 (GOWN DISPOSABLE) ×6
HEMOSTAT POWDER KIT SURGIFOAM (HEMOSTASIS) ×2 IMPLANT
KIT BASIN OR (CUSTOM PROCEDURE TRAY) ×2 IMPLANT
KIT INFUSE X SMALL 1.4CC (Orthopedic Implant) ×2 IMPLANT
KIT TURNOVER KIT B (KITS) ×2 IMPLANT
NEEDLE ASP BONE MRW 8GX15 (NEEDLE) ×2 IMPLANT
NEEDLE HYPO 25X1 1.5 SAFETY (NEEDLE) ×2 IMPLANT
NEEDLE SPNL 18GX3.5 QUINCKE PK (NEEDLE) ×2 IMPLANT
NEEDLE SPNL 22GX3.5 QUINCKE BK (NEEDLE) ×4 IMPLANT
NS IRRIG 1000ML POUR BTL (IV SOLUTION) ×4 IMPLANT
OIL CARTRIDGE MAESTRO DRILL (MISCELLANEOUS) ×2
PACK LAMINECTOMY NEURO (CUSTOM PROCEDURE TRAY) ×2 IMPLANT
PAD ARMBOARD 7.5X6 YLW CONV (MISCELLANEOUS) ×6 IMPLANT
PATTIES SURGICAL .5 X.5 (GAUZE/BANDAGES/DRESSINGS) ×2 IMPLANT
PATTIES SURGICAL .5 X1 (DISPOSABLE) ×2 IMPLANT
PATTIES SURGICAL 1X1 (DISPOSABLE) IMPLANT
ROD 5.5X30MM (Rod) ×4 IMPLANT
SCREW 5.75X40M (Screw) ×4 IMPLANT
SCREW 6.75X40MM (Screw) ×4 IMPLANT
SPONGE LAP 4X18 RFD (DISPOSABLE) IMPLANT
SPONGE NEURO XRAY DETECT 1X3 (DISPOSABLE) IMPLANT
SPONGE SURGIFOAM ABS GEL 100 (HEMOSTASIS) ×2 IMPLANT
STRIP BIOACTIVE VITOSS 25X100X (Neuro Prosthesis/Implant) ×2 IMPLANT
STRIP BIOACTIVE VITOSS 25X52X4 (Orthopedic Implant) ×2 IMPLANT
SUT VIC AB 1 CT1 18XBRD ANBCTR (SUTURE) ×2 IMPLANT
SUT VIC AB 1 CT1 8-18 (SUTURE) ×4
SUT VIC AB 2-0 CP2 18 (SUTURE) ×4 IMPLANT
SYR 3ML LL SCALE MARK (SYRINGE) ×8 IMPLANT
SYR CONTROL 10ML LL (SYRINGE) ×2 IMPLANT
TAPE CLOTH SURG 4X10 WHT LF (GAUZE/BANDAGES/DRESSINGS) ×2 IMPLANT
TOWEL GREEN STERILE (TOWEL DISPOSABLE) ×2 IMPLANT
TOWEL GREEN STERILE FF (TOWEL DISPOSABLE) ×2 IMPLANT
TRAY FOLEY MTR SLVR 16FR STAT (SET/KITS/TRAYS/PACK) ×2 IMPLANT
WATER STERILE IRR 1000ML POUR (IV SOLUTION) ×2 IMPLANT

## 2018-05-15 NOTE — Progress Notes (Signed)
Vitals:   05/15/18 1242 05/15/18 1246 05/15/18 1313 05/15/18 1634  BP: (!) 107/48 (!) 115/59 (!) 114/54 (!) 111/58  Pulse: (!) 52 (!) 52 (!) 56 (!) 57  Resp: 16 17 16 20   Temp:  (!) 97.3 F (36.3 C)    TempSrc:      SpO2: 97% 97% 99% 100%  Weight:      Height:        Patient resting in bed, having some left lumbar radicular pain, has had significant nausea and some vomiting.  Given Vistaril, and more recently Zofran.  Dressing clean and dry.  Limited ambulation so far.  Plan: Continue supportive care, progress activity as tolerated.  Continue to progress through postoperative recovery.  Spoke with patient and her husband, who was at bedside.  Hosie Spangle, MD 05/15/2018, 6:19 PM

## 2018-05-15 NOTE — Op Note (Signed)
05/15/2018  11:48 AM  PATIENT:  [name]  57 y.o. 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; lumbar spondylosis; lumbar degenerative disc disease; status post L5-S1 decompression and arthrodesis  PROCEDURE:  Procedure(s):  1) removal of existing Tectonix L5-S1 posterior instrumentation 2) bilateral L4-5 lumbar decompression including laminectomy, facetectomy, and foraminotomies for the exiting L4 and L5 nerve roots bilaterally, with decompression of central canal, lateral recess, and neuroforaminal stenosis, with decompression beyond that required for interbody arthrodesis; bilateral L4-5 posterior lumbar interbody arthrodesis with Tritanium interbody implants, Vitoss BA with bone marrow aspirate, and infuse; bilateral L4-5 posterior lateral arthrodesis with nonsegmental radius posterior instrumentation, Vitoss BA with bone marrow aspirate, and infuse  SURGEON: Jovita Gamma, MD  ASSISTANTS: Newman Pies, MD  ANESTHESIA:   general  EBL:  Total I/O In: 1400 [I.V.:1400] Out: 450 [Urine:250; Blood:200]  BLOOD ADMINISTERED:none  CELL SAVER GIVEN: Cell Saver technician felt that there was insufficient blood from the processed, collected blood to return it to the patient.  COUNT:  Correct per nursing staff  DICTATION: Patient is brought to the operating room placed under general endotracheal anesthesia. The patient was turned to prone position the lumbar region was prepped with Betadine soap and solution and draped in a sterile fashion. The midline was infiltrated with local anesthesia with epinephrine. A localizing x-ray was taken and then a midline incision was made, through the previous midline  incision, and carried down through the subcutaneous tissue.  Bipolar cautery and electrocautery were used to maintain hemostasis. Dissection was carried down to the lumbar fascia. The fascia was incised bilaterally and the paraspinal muscles were dissected with a spinous process and lamina in a subperiosteal fashion. Another x-ray was taken for localization and the L4-5 level was localized. Dissection was then carried out laterally, and we identified the existing L5-S1 posterior instrumentation.  Scar tissue overlying the posterior intra-mentation was removed.  We then unlocked the locking caps which were removed.  All 4 screws were removed.  We then remove the posterior plates.  Dissection was then carried over the L4-5 facet complex and the transverse processes of L4 and L5 were exposed and decorticated.  We then proceeded with the decompression.  The high-speed drill and Kerrison punches were used to perform a bilateral L4-5 laminotomies.  Thickened ligamentum flavum was carefully dissected from the thecal sac and nerve roots, and removed decompressing the central canal and lateral recesses bilaterally.  Dissection was carried out laterally including facetectomy and foraminotomies with decompression of the stenotic compression of the exiting L4 and L5 nerve roots bilaterally. Once the decompression was completed we proceeded with the posterior lumbar interbody arthrodesis. The annulus was incised bilaterally and the disc space entered. A thorough discectomy was performed using pituitary rongeurs and curettes. Once the discectomy was completed we began to prepare the endplate surfaces removing the cartilaginous endplates surface. We then measured the height of the intervertebral disc space. We selected a 12 x 23 x 6 x 9 Tritanium interbody implants.  The C-arm fluoroscope was then draped and brought in the field and we identified the pedicle entry points bilaterally at the L4 and L5 levels.  At L5 we created  new screw hole trajectories, rather than using the previous screws existing screw holes.  Each of the 4 pedicles was probed, we  aspirated bone marrow aspirate from the vertebral bodies, this was injected over a 10 cc and a 5 cc strip of Vitoss BA. Then each of the pedicles was examined with the ball probe good bony surfaces were found and no bony cuts were found. Each of the pedicles was then tapped with a 5.25 mm tap, again examined with the ball probe good threading was found and no bony cuts were found. We then placed 6.75 x 40 mm screws bilaterally at the L5 level and 5.75 x 40 mm screws bilaterally at the L4 level.  We then packed the interbody implants with Vitoss BA with bone marrow aspirate and infuse, and placed the first implant on the right side, carefully retracting the thecal sac and nerve root medially. We then went back to the left side and packed the midline with additional Vitoss BA with bone marrow aspirate and infuse, and then placed a second implant on the left side again retracting the thecal sac and nerve root medially. Additional Vitoss BA with bone marrow aspirate was packed lateral to the implants.  We then packed the lateral gutter over the transverse processes and intertransverse space with Vitoss BA with bone marrow aspirate infuse. We then selected 30 mm pre-lordosed rods, they were placed within the screw heads and secured with locking caps once all 4 locking caps were placed final tightening was performed against a counter torque.  The wound had been irrigated multiple times during the procedure with saline solution and bacitracin solution, good hemostasis was established with a combination of bipolar cautery and Gelfoam with thrombin.  The Gelfoam was removed, and a thin layer of Surgifoam applied.  Once good hemostasis was confirmed we proceeded with closure paraspinal muscles deep fascia and Scarpa's fascia were closed with interrupted undyed 1 Vicryl sutures the subcutaneous  and subcuticular closed with interrupted inverted 2-0 undyed Vicryl sutures the skin edges were approximated with Dermabond.  A dressing of sterile gauze and Hypafix was applied.  Following surgery the patient was turned back to the supine position to be reversed and the anesthetic extubated and transferred to the recovery room for further care.  PLAN OF CARE: Admit to inpatient   PATIENT DISPOSITION:  PACU - hemodynamically stable.   Delay start of Pharmacological VTE agent (>24hrs) due to surgical blood loss or risk of bleeding:  yes

## 2018-05-15 NOTE — Anesthesia Procedure Notes (Signed)
Procedure Name: Intubation Date/Time: 05/15/2018 7:36 AM Performed by: Lieutenant Diego, CRNA Pre-anesthesia Checklist: Patient identified, Emergency Drugs available, Suction available and Patient being monitored Patient Re-evaluated:Patient Re-evaluated prior to induction Oxygen Delivery Method: Circle system utilized Preoxygenation: Pre-oxygenation with 100% oxygen Induction Type: IV induction Ventilation: Mask ventilation without difficulty Laryngoscope Size: Miller, 2 and Glidescope Grade View: Grade III Tube type: Oral Number of attempts: 2 Airway Equipment and Method: Stylet Placement Confirmation: ETT inserted through vocal cords under direct vision,  positive ETCO2 and breath sounds checked- equal and bilateral Secured at: 23 cm Tube secured with: Tape Dental Injury: Teeth and Oropharynx as per pre-operative assessment  Difficulty Due To: Difficulty was anticipated and Difficult Airway- due to anterior larynx Comments: Small mouth, MP 3, DL times one, unable to see cords. Moved immediately to glide scope. Grade one view, difficult passing ETT due to anterior larynx. Sats 100%, tol well. Treating BP

## 2018-05-15 NOTE — Transfer of Care (Signed)
Immediate Anesthesia Transfer of Care Note  Patient: [name]  Procedure(s) Performed: REMOVAL OF LUMBAR INSTRUMENTATION LUMBAR FIVE- SACRAL ONE, LUMBAR FOUR- LUMBAR FIVE DECOMPRESSION,POSTERIOR LUMBAR INTERBODY FUSION, POSTERIOR LATERAL ARTHRODESIS (N/A Back)  Patient Location: PACU  Anesthesia Type:General  Level of Consciousness: awake and alert   Airway & Oxygen Therapy: Patient Spontanous Breathing and Patient connected to face mask oxygen  Post-op Assessment: Report given to RN and Post -op Vital signs reviewed and stable  Post vital signs: Reviewed and stable  Last Vitals:  Vitals Value Taken Time  BP 128/77 05/15/2018 11:57 AM  Temp    Pulse 79 05/15/2018 11:58 AM  Resp 15 05/15/2018 11:58 AM  SpO2 100 % 05/15/2018 11:58 AM  Vitals shown include unvalidated device data.  Last Pain:  Vitals:   05/15/18 0614  TempSrc: Oral  PainSc: 6       Patients Stated Pain Goal: 3 (67/73/73 6681)  Complications: No apparent anesthesia complications

## 2018-05-15 NOTE — H&P (Signed)
Subjective: Patient is a 57 y.o. right-handed white female who is admitted for treatment of advanced degeneration with spondylosis and degenerative disc disease at the L4-5 level with marked to severe canal stenosis and severe left lateral recess stenosis with resulting neurogenic claudication and left lumbar radiculopathy.  Notably she is status post a previous L5-S1 lumbar decompression with and PLA in February 2009.  Her exam has shown weakness in the left dorsiflexor and EHL.  She has been treated with nonsurgical measures over the past 9 months or more including NSAIDs, steroid Dosepaks, etc.  She is admitted now for removal of her existing L5-S1 posterior instrumentation, and a L4-5 lumbar decompression including laminectomy, facetectomy, and foraminotomy and lumbar stabilization with posterior lumbar interbody arthrodesis with interbody implants and bone graft and posterior lateral arthrodesis with posterior instrumentation and bone graft.   Patient Active Problem List   Diagnosis Date Noted  . Well woman exam with routine gynecological exam 08/30/2016  . Burning with urination 08/30/2016  . Hot flashes 08/30/2016  . Hematuria 08/30/2016  . Vaginal Pap smear following hysterectomy for malignancy 08/30/2016  . Screening for colorectal cancer 08/30/2016  . Mild intermittent asthma 12/15/2015  . Allergic rhinitis due to pollen 12/15/2015  . 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 Hypertension 08/12/2015  . Urinary incontinence 08/12/2015  . Special screening for malignant neoplasms, colon    Past Medical History:  Diagnosis Date  . Asthma   . Chronic back pain   . History of kidney stones   . Hypertension   . Hypothyroidism   . Mitral valve prolapse   . Normal cardiac stress test 2010  . PONV (postoperative nausea and vomiting)    hard to wake up  . SCC (squamous cell carcinoma)    chest  . Thyroid disease   . Urinary incontinence 08/12/2015    Past Surgical History:   Procedure Laterality Date  . ABDOMINAL HYSTERECTOMY    . BACK SURGERY     x3-ruptured disc  . bone spur Right    foot  . CARDIAC CATHETERIZATION  11/2009   normal coronary arteries ([facility])  . CHOLECYSTECTOMY    . COLONOSCOPY N/A 07/15/2014   Procedure: COLONOSCOPY;  Surgeon: Daneil Dolin, MD;  Location: [facility];  Service: Endoscopy;  Laterality: N/A;  . CYST REMOVAL HAND Right   . KNEE ARTHROSCOPY Left     Medications Prior to Admission  Medication Sig Dispense Refill Last Dose  . albuterol (PROAIR HFA) 108 (90 Base) MCG/ACT inhaler Inhale 2 puffs into the lungs every 4 (four) hours as needed for wheezing or shortness of breath. 1 Inhaler 3 Past Week at Unknown time  . diltiazem (CARDIZEM CD) 240 MG 24 hr capsule Take 240 mg by mouth at bedtime.  2 05/14/2018 at Unknown time  . EPINEPHrine 0.3 mg/0.3 mL IJ SOAJ injection Inject 0.3 mLs (0.3 mg total) into the muscle once. 2 Device 2 Taking  . levothyroxine (SYNTHROID, LEVOTHROID) 75 MCG tablet Take 75 mcg by mouth daily before breakfast.   05/15/2018 at 0400  . metoprolol tartrate (LOPRESSOR) 50 MG tablet Take 50 mg by mouth 2 (two) times daily as needed (BP higher than 140/89).   2 05/14/2018 at Unknown time  . montelukast (SINGULAIR) 10 MG tablet Take 10 mg by mouth daily as needed (for allergies).    Past Week at Unknown time  . naproxen sodium (ALEVE) 220 MG tablet Take 440 mg by mouth daily as needed (for pain).  Past Month at Unknown time  . olmesartan (BENICAR) 20 MG tablet Take 20 mg by mouth daily.   05/15/2018 at 0400  . hydrocortisone (ANUSOL-HC) 25 MG suppository Place 1 suppository (25 mg total) rectally 2 (two) times daily. (Patient not taking: Reported on 05/02/2018) 12 suppository 0 Not Taking at Unknown time   Allergies  Allergen Reactions  . Peanut-Containing Drug Products Hives and Shortness Of Breath  . Dilaudid [Hydromorphone Hcl] Nausea And Vomiting       . Morphine And Related Nausea And Vomiting  .  Phenergan [Promethazine Hcl] Nausea And Vomiting    Social History   Tobacco Use  . Smoking status: Never Smoker  . Smokeless tobacco: Never Used  Substance Use Topics  . Alcohol use: No    Family History  Problem Relation Age of Onset  . COPD Mother   . Cancer Mother        skin  . Hernia Mother   . Fibromyalgia Mother   . Hypertension Mother   . Asthma Mother   . Other Father        open heart surgery  . Hypertension Father   . Hypertension Brother   . Interstitial cystitis Daughter   . Allergic rhinitis Daughter   . Asthma Daughter   . Cancer Maternal Grandmother        cervical  . Heart attack Maternal Grandmother        died at age 67  . Cancer Maternal Grandfather        colon  . Cancer Paternal Grandmother        colon,cervical,melonoma  . Other Paternal Grandmother        open heart surgery  . Arthritis Paternal Grandfather   . Leukemia Paternal Grandfather      Review of Systems Pertinent items noted in HPI and remainder of comprehensive ROS otherwise negative.  Objective: Vital signs in last 24 hours: Temp:  [97.6 F (36.4 C)] 97.6 F (36.4 C) (10/21 6063) Pulse Rate:  [75] 75 (10/21 0614) Resp:  [20] 20 (10/21 0614) BP: (174)/(90) 174/90 (10/21 0614) SpO2:  [99 %] 99 % (10/21 0614) Weight:  [65.3 kg] 65.3 kg (10/21 0160)  EXAM: She is a well-developed well-nourished female in no acute distress.   Lungs are clear to auscultation , the patient has symmetrical respiratory excursion. Heart has a regular rate and rhythm normal S1 and S2 no murmur.   Abdomen is soft nontender nondistended bowel sounds are present. Extremity examination shows no clubbing cyanosis or edema. Motor examination shows the iliopsoas and quadriceps are 5 bilaterally, the left dorsiflexor and EHL are 4, the left plantar flexors 5, the right dorsiflexor, EHL, and plantar flexor 5.  Sensation is intact pinprick to the distal lower extremities.  Reflexes are 2 at the quadriceps  bilaterally, left gastrocnemius is 1-2, right gastrocnemius is absent.  Toes are downgoing bilaterally.  Gait and stance favor the left lower extremity.  Data Review:CBC    Component Value Date/Time   WBC 4.4 05/08/2018 0947   RBC 5.23 (H) 05/08/2018 0947   HGB 15.3 (H) 05/08/2018 0947   HCT 48.9 (H) 05/08/2018 0947   PLT 194 05/08/2018 0947   MCV 93.5 05/08/2018 0947   MCH 29.3 05/08/2018 0947   MCHC 31.3 05/08/2018 0947   RDW 11.5 05/08/2018 0947   LYMPHSABS 0.9 05/03/2015 0457   MONOABS 0.6 05/03/2015 0457   EOSABS 0.0 05/03/2015 0457   BASOSABS 0.0 05/03/2015 0457  BMET    Component Value Date/Time   NA 141 05/08/2018 0947   K 4.1 05/08/2018 0947   CL 107 05/08/2018 0947   CO2 26 05/08/2018 0947   GLUCOSE 94 05/08/2018 0947   BUN 21 (H) 05/08/2018 0947   CREATININE 0.76 05/08/2018 0947   CALCIUM 9.4 05/08/2018 0947   GFRNONAA >60 05/08/2018 0947   GFRAA >60 05/08/2018 0947     Assessment/Plan: Patient with advanced degeneration and stenosis at the L4-5 level is admitted for decompression and stabilization.  I've discussed with the patient the nature of his condition, the nature the surgical procedure, the typical length of surgery, hospital stay, and overall recuperation, the limitations postoperatively, and risks of surgery. I discussed risks including risks of infection, bleeding, possibly need for transfusion, the risk of nerve root dysfunction with pain, weakness, numbness, or paresthesias, the risk of dural tear and CSF leakage and possible need for further surgery, the risk of failure of the arthrodesis and possibly for further surgery, the risk of anesthetic complications including myocardial infarction, stroke, pneumonia, and death. We discussed the need for postoperative immobilization in a lumbar brace. Understanding all this the patient does wish to proceed with surgery and is admitted for such.   Hosie Spangle, MD 05/15/2018 7:05  AM

## 2018-05-15 NOTE — Anesthesia Preprocedure Evaluation (Signed)
Anesthesia Evaluation  Patient identified by MRN, date of birth, ID band Patient awake    Reviewed: Allergy & Precautions, NPO status , Patient's Chart, lab work & pertinent test results, reviewed documented beta blocker date and time   History of Anesthesia Complications (+) PONV and history of anesthetic complications  Airway Mallampati: IV  TM Distance: <3 FB Neck ROM: Full    Dental  (+) Teeth Intact   Pulmonary neg shortness of breath, asthma , neg sleep apnea, neg recent URI,    breath sounds clear to auscultation       Cardiovascular hypertension, Pt. on medications and Pt. on home beta blockers (-) angina(-) Past MI and (-) CHF + Valvular Problems/Murmurs MVP  Rhythm:Regular     Neuro/Psych  Neuromuscular disease negative psych ROS   GI/Hepatic negative GI ROS, Neg liver ROS,   Endo/Other  Hypothyroidism   Renal/GU negative Renal ROS     Musculoskeletal negative musculoskeletal ROS (+)   Abdominal   Peds  Hematology negative hematology ROS (+)   Anesthesia Other Findings   Reproductive/Obstetrics                             Anesthesia Physical Anesthesia Plan  ASA: II  Anesthesia Plan: General   Post-op Pain Management:    Induction: Intravenous  PONV Risk Score and Plan: 4 or greater and Ondansetron, Dexamethasone, Propofol infusion, Scopolamine patch - Pre-op, Midazolam and TIVA  Airway Management Planned: Oral ETT  Additional Equipment: None  Intra-op Plan:   Post-operative Plan: Extubation in OR  Informed Consent: I have reviewed the patients History and Physical, chart, labs and discussed the procedure including the risks, benefits and alternatives for the proposed anesthesia with the patient or authorized representative who has indicated his/her understanding and acceptance.   Dental advisory given  Plan Discussed with: CRNA and Surgeon  Anesthesia Plan  Comments:         Anesthesia Quick Evaluation

## 2018-05-16 MED ORDER — HYDROCODONE-ACETAMINOPHEN 5-325 MG PO TABS: 1.0000 | ORAL_TABLET | ORAL | 0 refills | Status: DC | PRN

## 2018-05-16 MED FILL — Thrombin (Recombinant) For Soln 20000 Unit: CUTANEOUS | Qty: 1 | Status: AC

## 2018-05-16 NOTE — Anesthesia Postprocedure Evaluation (Signed)
Anesthesia Post Note  Patient: [name]  Procedure(s) Performed: REMOVAL OF LUMBAR INSTRUMENTATION LUMBAR FIVE- SACRAL ONE, LUMBAR FOUR- LUMBAR FIVE DECOMPRESSION,POSTERIOR LUMBAR INTERBODY FUSION, POSTERIOR LATERAL ARTHRODESIS (N/A Back)     Patient location during evaluation: PACU Anesthesia Type: General Level of consciousness: awake and alert Pain management: pain level controlled Vital Signs Assessment: post-procedure vital signs reviewed and stable Respiratory status: spontaneous breathing, nonlabored ventilation, respiratory function stable and patient connected to nasal cannula oxygen Cardiovascular status: blood pressure returned to baseline and stable Postop Assessment: no apparent nausea or vomiting Anesthetic complications: no    Last Vitals:  Vitals:   05/16/18 1223 05/16/18 1555  BP: 117/62 108/61  Pulse: 79 65  Resp: 18 19  Temp:  36.8 C  SpO2: 100% 98%    Last Pain:  Vitals:   05/16/18 1555  TempSrc: Oral  PainSc:                  Airis Barbee

## 2018-05-16 NOTE — Discharge Instructions (Signed)
Wound Care Leave incision open to air. You may shower. Do not scrub directly on incision.  Do not put any creams, lotions, or ointments on incision. Activity Walk each and every day, increasing distance each day. No lifting greater than 5 lbs.  Avoid bending, arching, and twisting. No driving for 2 weeks; may ride as a passenger locally. If provided with back brace, wear when out of bed.  DO NOT wear brace in bed Diet Resume your normal diet.  Return to Work Will be discussed at you follow up appointment. Call Your Doctor If Any of These Occur Redness, drainage, or swelling at the wound.  Temperature greater than 101 degrees. Severe pain not relieved by pain medication. Incision starts to come apart. Follow Up Appt Call today for appointment in 3 weeks ([phone]) or for problems.  If you have any hardware placed in your spine, you will need an x-ray before your appointment.

## 2018-05-16 NOTE — Progress Notes (Signed)
Vitals:   05/15/18 1931 05/15/18 2325 05/16/18 0430 05/16/18 0711  BP: (!) 107/58 107/62 (!) 103/55 (!) 94/52  Pulse: 62 64 72 70  Resp: 18 18 18 18   Temp: (!) 97.5 F (36.4 C) 98 F (36.7 C) 98.3 F (36.8 C) 97.8 F (36.6 C)  TempSrc: Oral Oral Oral Oral  SpO2: 100% 97% 98% 97%  Weight:      Height:        Patient sitting up on the side of the bed. Has ambulated down the hall. Some left lumbar radicular discomfort. Dressing removed, and incision clean and dry; no erythema, ecchymosis, swelling, or drainage. Nausea has substantially resolved. Mobility somewhat limited, encouraged to ambulate 4 times today through the halls. Foley DC'd, and patient has voided.    Plan: Continue to progress through postoperative recovery. Encouraged ambulate.  Hosie Spangle, MD 05/16/2018, 8:35 AM

## 2018-05-16 NOTE — Discharge Summary (Signed)
Physician Discharge Summary  Patient ID: [name] MRN: [id number] DOB/AGE: 55-Jun-1962 55 y.o.  Admit date: 05/15/2018 Discharge date: 05/16/2018  Admission Diagnoses:  L4-5 lumbar stenosis with neurogenic claudication and left lumbar radiculopathy; lumbar spondylosis; lumbar degenerative disc disease; status post L5-S1 decompression and arthrodesis  Discharge Diagnoses:  L4-5 lumbar stenosis with neurogenic claudication and left lumbar radiculopathy; lumbar spondylosis; lumbar degenerative disc disease; status post L5-S1 decompression and arthrodesis Active Problems:   Lumbar stenosis with neurogenic claudication   Discharged Condition: good  Hospital Course:  Patient was admitted, had instrumentation removed at the L5-S1 level, and underwent a bilateral L4-5 lumbar decompression and arthrodesis.  Postoperatively she initially had difficulties with nausea and vomiting, but that is progressively stabilized and resolved.  She is been reasonably comfortable, and is up and ambulating actively in the halls.  Her incision is healing nicely, it is clean and dry.  She is being discharged home with instructions regarding wound care and activities.  She is scheduled for follow-up with me in the office in about 3 weeks.  Discharge Exam: Blood pressure 108/61, pulse 65, temperature 98.2 F (36.8 C), temperature source Oral, resp. rate 19, height 5\' 5"  (1.651 m), weight 65.3 kg, SpO2 98 %.  Disposition: Discharge disposition: 01-Home or Self Care       Discharge Instructions    Discharge wound care:   Complete by:  As directed    Leave the wound open to air. Shower daily with the wound uncovered. Water and soapy water should run over the incision area. Do not wash directly on the incision for 2 weeks. Remove the glue after 2 weeks.   Driving Restrictions   Complete by:  As directed    No driving for 2 weeks. May ride in the car locally now. May begin to drive locally in 2 weeks.    Other Restrictions   Complete by:  As directed    Walk gradually increasing distances out in the fresh air at least twice a day. Walking additional 6 times inside the house, gradually increasing distances, daily. No bending, lifting, or twisting. Perform activities between shoulder and waist height (that is at counter height when standing or table height when sitting).     Allergies as of 05/16/2018      Reactions   Peanut-containing Drug Products Hives, Shortness Of Breath   Dilaudid [hydromorphone Hcl] Nausea And Vomiting       Morphine And Related Nausea And Vomiting   Phenergan [promethazine Hcl] Nausea And Vomiting      Medication List    STOP taking these medications   hydrocortisone 25 MG suppository Commonly known as:  ANUSOL-HC     TAKE these medications   albuterol 108 (90 Base) MCG/ACT inhaler Commonly known as:  PROVENTIL HFA;VENTOLIN HFA Inhale 2 puffs into the lungs every 4 (four) hours as needed for wheezing or shortness of breath.   ALEVE 220 MG tablet Generic drug:  naproxen sodium Take 440 mg by mouth daily as needed (for pain).   BENICAR 20 MG tablet Generic drug:  olmesartan Take 20 mg by mouth daily.   diltiazem 240 MG 24 hr capsule Commonly known as:  CARDIZEM CD Take 240 mg by mouth at bedtime.   EPINEPHrine 0.3 mg/0.3 mL Soaj injection Commonly known as:  EPI-PEN Inject 0.3 mLs (0.3 mg total) into the muscle once.   HYDROcodone-acetaminophen 5-325 MG tablet Commonly known as:  NORCO/VICODIN Take 1-2 tablets by mouth every 4 (four) hours as needed (pain).  levothyroxine 75 MCG tablet Commonly known as:  SYNTHROID, LEVOTHROID Take 75 mcg by mouth daily before breakfast.   metoprolol tartrate 50 MG tablet Commonly known as:  LOPRESSOR Take 50 mg by mouth 2 (two) times daily as needed (BP higher than 140/89).   montelukast 10 MG tablet Commonly known as:  SINGULAIR Take 10 mg by mouth daily as needed (for allergies).             Discharge Care Instructions  (From admission, onward)         Start     Ordered   05/16/18 0000  Discharge wound care:    Comments:  Leave the wound open to air. Shower daily with the wound uncovered. Water and soapy water should run over the incision area. Do not wash directly on the incision for 2 weeks. Remove the glue after 2 weeks.   05/16/18 1759           Signed: Hosie Spangle 05/16/2018, 5:59 PM

## 2018-05-16 NOTE — Progress Notes (Signed)
Discharged to home, husband at bedside. D/c instructions/limitations and follow up appointments discussed with patient,verbalized understanding.

## 2018-05-17 MED FILL — Heparin Sodium (Porcine) Inj 1000 Unit/ML: INTRAMUSCULAR | Qty: 30 | Status: AC

## 2018-05-17 MED FILL — Sodium Chloride IV Soln 0.9%: INTRAVENOUS | Qty: 1000 | Status: AC

## 2018-06-09 DIAGNOSIS — M48062 Spinal stenosis, lumbar region with neurogenic claudication: Secondary | ICD-10-CM | POA: Diagnosis not present

## 2018-06-13 DIAGNOSIS — Z1389 Encounter for screening for other disorder: Secondary | ICD-10-CM | POA: Diagnosis not present

## 2018-06-13 DIAGNOSIS — I341 Nonrheumatic mitral (valve) prolapse: Secondary | ICD-10-CM | POA: Diagnosis not present

## 2018-06-13 DIAGNOSIS — E559 Vitamin D deficiency, unspecified: Secondary | ICD-10-CM | POA: Diagnosis not present

## 2018-06-13 DIAGNOSIS — I10 Essential (primary) hypertension: Secondary | ICD-10-CM | POA: Diagnosis not present

## 2018-06-13 DIAGNOSIS — E0590 Thyrotoxicosis, unspecified without thyrotoxic crisis or storm: Secondary | ICD-10-CM | POA: Diagnosis not present

## 2018-06-13 DIAGNOSIS — E663 Overweight: Secondary | ICD-10-CM | POA: Diagnosis not present

## 2018-07-12 ENCOUNTER — Telehealth: Payer: Self-pay | Admitting: Adult Health

## 2018-07-12 ENCOUNTER — Other Ambulatory Visit: Payer: Self-pay | Admitting: Adult Health

## 2018-07-12 DIAGNOSIS — N644 Mastodynia: Secondary | ICD-10-CM

## 2018-07-12 DIAGNOSIS — R52 Pain, unspecified: Secondary | ICD-10-CM

## 2018-07-12 NOTE — Telephone Encounter (Signed)
Patient called, stated that she called today to schedule her mammogram and because she is having some tenderness that she would need a diagnostic mammogram, which she stated requires an order.  She has scheduled her pap/physical for 08/14/18 at 1:30pm.  She has met her deductible and would like for Anderson Malta to go ahead an send the order to [facility] so she can go ahead and get this done.  [phone]

## 2018-07-12 NOTE — Telephone Encounter (Signed)
Has appt for physical in January, needs mammogram and was told had to get diagnostic due to tenderness, it is on right near nipple, will get bilateral diagnostic mammogram and [place] 12/31 at 1:40 pm at [facility].

## 2018-07-25 ENCOUNTER — Ambulatory Visit (HOSPITAL_COMMUNITY): Payer: BLUE CROSS/BLUE SHIELD

## 2018-07-25 ENCOUNTER — Ambulatory Visit (HOSPITAL_COMMUNITY)
Admission: RE | Admit: 2018-07-25 | Discharge: 2018-07-25 | Disposition: A | Discharge status: Home / Self Care | Payer: BLUE CROSS/BLUE SHIELD | Source: Ambulatory Visit | Attending: Adult Health | Admitting: Adult Health

## 2018-07-25 DIAGNOSIS — R748 Abnormal levels of other serum enzymes: Secondary | ICD-10-CM | POA: Diagnosis not present

## 2018-07-25 DIAGNOSIS — R52 Pain, unspecified: Secondary | ICD-10-CM | POA: Insufficient documentation

## 2018-07-25 DIAGNOSIS — R922 Inconclusive mammogram: Secondary | ICD-10-CM | POA: Diagnosis not present

## 2018-08-01 DIAGNOSIS — Z981 Arthrodesis status: Secondary | ICD-10-CM | POA: Diagnosis not present

## 2018-08-14 ENCOUNTER — Other Ambulatory Visit: Payer: BLUE CROSS/BLUE SHIELD | Admitting: Adult Health

## 2018-09-12 DIAGNOSIS — D225 Melanocytic nevi of trunk: Secondary | ICD-10-CM | POA: Diagnosis not present

## 2018-09-12 DIAGNOSIS — Z85828 Personal history of other malignant neoplasm of skin: Secondary | ICD-10-CM | POA: Diagnosis not present

## 2018-09-12 DIAGNOSIS — L905 Scar conditions and fibrosis of skin: Secondary | ICD-10-CM | POA: Diagnosis not present

## 2018-09-12 DIAGNOSIS — L570 Actinic keratosis: Secondary | ICD-10-CM | POA: Diagnosis not present

## 2018-09-12 DIAGNOSIS — D485 Neoplasm of uncertain behavior of skin: Secondary | ICD-10-CM | POA: Diagnosis not present

## 2018-09-12 DIAGNOSIS — D1801 Hemangioma of skin and subcutaneous tissue: Secondary | ICD-10-CM | POA: Diagnosis not present

## 2018-09-13 ENCOUNTER — Encounter (INDEPENDENT_AMBULATORY_CARE_PROVIDER_SITE_OTHER): Payer: Self-pay

## 2018-09-13 ENCOUNTER — Encounter: Payer: Self-pay | Admitting: Adult Health

## 2018-09-13 ENCOUNTER — Ambulatory Visit (INDEPENDENT_AMBULATORY_CARE_PROVIDER_SITE_OTHER): Payer: BLUE CROSS/BLUE SHIELD | Admitting: Adult Health

## 2018-09-13 VITALS — BP 118/69 | HR 76 | Ht 65.0 in | Wt 152.0 lb

## 2018-09-13 DIAGNOSIS — Z1212 Encounter for screening for malignant neoplasm of rectum: Secondary | ICD-10-CM

## 2018-09-13 DIAGNOSIS — I341 Nonrheumatic mitral (valve) prolapse: Secondary | ICD-10-CM

## 2018-09-13 DIAGNOSIS — Z01419 Encounter for gynecological examination (general) (routine) without abnormal findings: Secondary | ICD-10-CM | POA: Insufficient documentation

## 2018-09-13 DIAGNOSIS — Z1211 Encounter for screening for malignant neoplasm of colon: Secondary | ICD-10-CM

## 2018-09-13 LAB — HEMOCCULT GUIAC POC 1CARD (OFFICE): Fecal Occult Blood, POC: NEGATIVE

## 2018-09-13 NOTE — Progress Notes (Signed)
Patient ID: [name], female   DOB: 06/11/1961, 58 y.o.   MRN: [id number] History of Present Illness: [name] is a 58 year old white female, married in for a well woman gyn exam, she is sp hysterectomy.She had back surgery in October 2019 PCP is Dr Hilma Favors.She sees Dr Suzette Battiest, too.   Current Medications, Allergies, Past Medical History, Past Surgical History, Family History and Social History were reviewed in [employer] record.     Review of Systems: Patient denies any headaches, hearing loss, fatigue, blurred vision, chest pain, abdominal pain, problems with bowel movements, urination, or intercourse. No joint pain or mood swings. +shortness of birth, +palpitations at time, no chest pain lately, has history of MVP She had had moles removed by Dr Tarri Glenn recently.   Physical Exam:BP 118/69 (BP Location: Left Arm, Patient Position: Sitting, Cuff Size: Normal)   Pulse 76   Ht 5\' 5"  (1.651 m)   Wt 152 lb (68.9 kg)   BMI 25.29 kg/m  General:  Well developed, well nourished, no acute distress Skin:  Warm and dry,has had moles removed recently  Neck:  Midline trachea, normal thyroid, good ROM, no lymphadenopathy Lungs; Clear to auscultation bilaterally Breast:  No dominant palpable mass, retraction, or nipple discharge Cardiovascular: Regular rate and rhythm Abdomen:  Soft, non tender, no hepatosplenomegaly Pelvic:  External genitalia is normal in appearance, no lesions.  The vagina is normal in appearance. Urethra has no lesions or masses. The cervix and uterus are absent.No adnexal masses or tenderness noted.Bladder is non tender, no masses felt. Rectal: Good sphincter tone, no polyps, or hemorrhoids felt.  Hemoccult negative. Extremities/musculoskeletal:  No swelling or varicosities noted, no clubbing or cyanosis Psych:  No mood changes, alert and cooperative,seems happy Fall risk is low. PHQ 2 score 0. Examination chaperoned by Estill Bamberg Rash  LPN.  Impression: 1. Encounter for well woman exam with routine gynecological exam   2. Screening for colorectal cancer   3. MVP (mitral valve prolapse)       Plan: Physical and pap in 1 year Mammogram yearly Colonoscopy per GI Labs with PCP and Dr Suzette Battiest Referred to cardiology for evaluation of MVP hs shortness of breath and palpitations at times  Dr Hilma Favors treats her BP

## 2018-09-18 ENCOUNTER — Ambulatory Visit: Payer: BLUE CROSS/BLUE SHIELD | Admitting: Cardiology

## 2018-09-18 ENCOUNTER — Encounter: Payer: Self-pay | Admitting: Cardiology

## 2018-09-18 VITALS — BP 144/80 | HR 90 | Ht 65.0 in | Wt 153.0 lb

## 2018-09-18 DIAGNOSIS — R002 Palpitations: Secondary | ICD-10-CM

## 2018-09-18 DIAGNOSIS — I10 Essential (primary) hypertension: Secondary | ICD-10-CM

## 2018-09-18 DIAGNOSIS — I341 Nonrheumatic mitral (valve) prolapse: Secondary | ICD-10-CM | POA: Diagnosis not present

## 2018-09-18 NOTE — Progress Notes (Signed)
Cardiology Office Note  Date: 09/18/2018   ID: Darrion, Macaulay 1960-08-11, MRN [id number]  PCP: Sharilyn Sites, MD  Consulting Cardiologist: Rozann Lesches, MD   Chief Complaint  Patient presents with  . Mitral Valve Prolapse    History of Present Illness: [name] is a 58 y.o. female referred for cardiology consultation by Ms. Laurann Montana NP with history of mitral valve prolapse.  I reviewed available records which are incomplete at this point.  In talking with her today, she previously followed with Dr. Lattie Haw several years ago for hypertension, was subsequently seen by [facility] and underwent stress testing followed by cardiac catheterization that ultimately demonstrated normal coronary arteries as of 2011.  She states that she was diagnosed with mitral valve prolapse along the way.  She does describe intermittent palpitations, perhaps as frequently as twice a week.  She does not report any chest pain or syncope in association with her palpitations.  Unrelated to these symptoms she reports intermittent dyspnea on exertion.  She does have asthma and is on Singulair and Proair.  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  I reviewed her medications which are outlined below.  She reports compliance.  Past Medical History:  Diagnosis Date  . Asthma   . Chronic back pain   . History of cardiac catheterization 2011   Reportedly normal coronary arteries - [facility]  . History of kidney stones   . History of skin cancer   . Hypertension   . Hypothyroidism   . Hypothyroidism   . Mitral valve prolapse   . Urinary incontinence 08/12/2015    Past Surgical History:  Procedure Laterality Date  . ABDOMINAL HYSTERECTOMY    . BACK SURGERY     x3-ruptured disc  . Bone spur Right    Foot  . CARDIAC CATHETERIZATION  11/2009   Normal coronary arteries ([facility])  . CHOLECYSTECTOMY    . COLONOSCOPY N/A 07/15/2014   Procedure: COLONOSCOPY;  Surgeon: Daneil Dolin, MD;   [facility];  Service: Endoscopy;  Laterality: N/A;  . CYST REMOVAL HAND Right   . KNEE ARTHROSCOPY Left     Current Outpatient Medications  Medication Sig Dispense Refill  . albuterol (PROAIR HFA) 108 (90 Base) MCG/ACT inhaler Inhale 2 puffs into the lungs every 4 (four) hours as needed for wheezing or shortness of breath. 1 Inhaler 3  . diltiazem (CARDIZEM CD) 240 MG 24 hr capsule Take 240 mg by mouth at bedtime.  2  . EPINEPHrine 0.3 mg/0.3 mL IJ SOAJ injection Inject 0.3 mLs (0.3 mg total) into the muscle once. 2 Device 2  . HYDROcodone-acetaminophen (NORCO/VICODIN) 5-325 MG tablet Take 1-2 tablets by mouth every 4 (four) hours as needed (pain). 40 tablet 0  . levothyroxine (SYNTHROID, LEVOTHROID) 75 MCG tablet Take 75 mcg by mouth daily before breakfast.    . metoprolol tartrate (LOPRESSOR) 50 MG tablet Take 50 mg by mouth 2 (two) times daily as needed (BP higher than 140/89).   2  . montelukast (SINGULAIR) 10 MG tablet Take 10 mg by mouth daily as needed (for allergies).     . naproxen sodium (ALEVE) 220 MG tablet Take 440 mg by mouth daily as needed (for pain).    Marland Kitchen olmesartan-hydrochlorothiazide (BENICAR HCT) 40-25 MG tablet Take 1 tablet by mouth daily.      No current facility-administered medications for this visit.    Allergies:  Peanut-containing drug products; Dilaudid [hydromorphone hcl]; Morphine and related; and Phenergan [  promethazine hcl]   Social History: The patient  reports that she has never smoked. She has never used smokeless tobacco. She reports that she does not drink alcohol or use drugs.  Family History: The patient's family history includes Allergic rhinitis in her daughter; Arthritis in her paternal grandfather; Asthma in her daughter and mother; COPD in her mother; Cancer in her maternal grandfather, maternal grandmother, mother, and paternal grandmother; Fibromyalgia in her mother; Heart attack in her maternal grandmother; Hernia in her mother;  Hypertension in her brother, father, and mother; Interstitial cystitis in her daughter; Leukemia in her paternal grandfather; Other in her father and paternal grandmother.   ROS:  Please see the history of present illness. Otherwise, complete review of systems is positive for improved back pain following surgery.  All other systems are reviewed and negative.   Physical Exam: VS:  BP (!) 144/80 (BP Location: Left Arm)   Pulse 90   Ht 5\' 5"  (1.651 m)   Wt 153 lb (69.4 kg)   SpO2 98%   BMI 25.46 kg/m , BMI Body mass index is 25.46 kg/m.  Wt Readings from Last 3 Encounters:  09/18/18 153 lb (69.4 kg)  09/13/18 152 lb (68.9 kg)  05/15/18 143 lb 14.4 oz (65.3 kg)    General: Patient appears comfortable at rest. HEENT: Conjunctiva and lids normal, oropharynx clear. Neck: Supple, no elevated JVP or carotid bruits, no thyromegaly. Lungs: Clear to auscultation, nonlabored breathing at rest. Cardiac: Regular rate and rhythm, no S3 or significant systolic murmur, no pericardial rub.  No obvious midsystolic click. Abdomen: Soft, nontender, bowel sounds present. Extremities: No pitting edema, distal pulses 2+. Skin: Warm and dry. Musculoskeletal: No kyphosis. Neuropsychiatric: Alert and oriented x3, affect grossly appropriate.  ECG: I personally reviewed the tracing from 08/08/2017 which showed normal sinus rhythm with nonspecific T wave changes.  Recent Labwork: 05/08/2018: BUN 21; Creatinine, Ser 0.76; Hemoglobin 15.3; Platelets 194; Potassium 4.1; Sodium 141   Assessment and Plan:  1.  Reported history of mitral valve prolapse.  She does not have a midsystolic click on examination.  With intermittent palpitations and shortness of breath we will obtain a follow-up echocardiogram however for repeat cardiac assessment and also a 7-day event monitor to exclude any significant arrhythmias.  2.  Essential hypertension, on multimodal therapy including Cardizem CD, Lopressor, and Benicar HCT.  She  follows with Dr. Hilma Favors.  3.  History of normal coronary arteries a cardiac catheterization back in 2011.  Current medicines were reviewed with the patient today.   Orders Placed This Encounter  Procedures  . Cardiac event monitor  . ECHOCARDIOGRAM COMPLETE    Disposition: Call with test results.  Signed, Satira Sark, MD, [facility] 09/18/2018 1:34 PM    [facility] [address], [place], [place] [address] Phone: [phone]; Fax: [phone]

## 2018-09-18 NOTE — Patient Instructions (Addendum)
  Medication Instructions:  Your physician recommends that you continue on your current medications as directed. Please refer to the Current Medication list given to you today.   Labwork: none  Testing/Procedures: Your physician has requested that you have an echocardiogram. Echocardiography is a painless test that uses sound waves to create images of your heart. It provides your doctor with information about the size and shape of your heart and how well your heart's chambers and valves are working. This procedure takes approximately one hour. There are no restrictions for this procedure.  Your physician has recommended that you wear an event monitor. Event monitors are medical devices that record the heart's electrical activity. Doctors most often [place] these monitors to diagnose arrhythmias. Arrhythmias are problems with the speed or rhythm of the heartbeat. The monitor is a small, portable device. You can wear one while you do your normal daily activities. This is usually used to diagnose what is causing palpitations/syncope (passing out).    Follow-Up: Your physician recommends that you schedule a follow-up appointment in: we will call with results    Any Other Special Instructions Will Be Listed Below (If Applicable).     If you need a refill on your cardiac medications before your next appointment, please call your pharmacy.

## 2018-09-19 ENCOUNTER — Ambulatory Visit (HOSPITAL_COMMUNITY)
Admission: RE | Admit: 2018-09-19 | Discharge: 2018-09-19 | Disposition: A | Discharge status: Home / Self Care | Payer: BLUE CROSS/BLUE SHIELD | Source: Ambulatory Visit | Attending: Cardiology | Admitting: Cardiology

## 2018-09-19 ENCOUNTER — Encounter (INDEPENDENT_AMBULATORY_CARE_PROVIDER_SITE_OTHER): Payer: BLUE CROSS/BLUE SHIELD

## 2018-09-19 DIAGNOSIS — I341 Nonrheumatic mitral (valve) prolapse: Secondary | ICD-10-CM | POA: Insufficient documentation

## 2018-09-19 DIAGNOSIS — R002 Palpitations: Secondary | ICD-10-CM | POA: Diagnosis not present

## 2018-09-19 NOTE — Progress Notes (Signed)
*  PRELIMINARY RESULTS* Echocardiogram 2D Echocardiogram has been performed.  [name] 09/19/2018, 1:40 PM

## 2018-10-10 ENCOUNTER — Ambulatory Visit: Payer: BLUE CROSS/BLUE SHIELD | Admitting: Cardiovascular Disease

## 2018-12-01 DIAGNOSIS — M5136 Other intervertebral disc degeneration, lumbar region: Secondary | ICD-10-CM | POA: Diagnosis not present

## 2018-12-01 DIAGNOSIS — M47816 Spondylosis without myelopathy or radiculopathy, lumbar region: Secondary | ICD-10-CM | POA: Diagnosis not present

## 2018-12-01 DIAGNOSIS — Z6826 Body mass index (BMI) 26.0-26.9, adult: Secondary | ICD-10-CM | POA: Diagnosis not present

## 2018-12-01 DIAGNOSIS — Z981 Arthrodesis status: Secondary | ICD-10-CM | POA: Diagnosis not present

## 2018-12-12 DIAGNOSIS — I10 Essential (primary) hypertension: Secondary | ICD-10-CM | POA: Diagnosis not present

## 2018-12-12 DIAGNOSIS — E890 Postprocedural hypothyroidism: Secondary | ICD-10-CM | POA: Diagnosis not present

## 2018-12-28 DIAGNOSIS — E663 Overweight: Secondary | ICD-10-CM | POA: Diagnosis not present

## 2018-12-28 DIAGNOSIS — Z1389 Encounter for screening for other disorder: Secondary | ICD-10-CM | POA: Diagnosis not present

## 2018-12-28 DIAGNOSIS — M19071 Primary osteoarthritis, right ankle and foot: Secondary | ICD-10-CM | POA: Diagnosis not present

## 2018-12-28 DIAGNOSIS — Z6826 Body mass index (BMI) 26.0-26.9, adult: Secondary | ICD-10-CM | POA: Diagnosis not present

## 2018-12-28 DIAGNOSIS — M1991 Primary osteoarthritis, unspecified site: Secondary | ICD-10-CM | POA: Diagnosis not present

## 2019-01-22 DIAGNOSIS — R5383 Other fatigue: Secondary | ICD-10-CM | POA: Diagnosis not present

## 2019-01-22 DIAGNOSIS — E049 Nontoxic goiter, unspecified: Secondary | ICD-10-CM | POA: Diagnosis not present

## 2019-01-22 DIAGNOSIS — E890 Postprocedural hypothyroidism: Secondary | ICD-10-CM | POA: Diagnosis not present

## 2019-01-22 DIAGNOSIS — I10 Essential (primary) hypertension: Secondary | ICD-10-CM | POA: Diagnosis not present

## 2019-01-22 DIAGNOSIS — Z833 Family history of diabetes mellitus: Secondary | ICD-10-CM | POA: Diagnosis not present

## 2019-01-29 DIAGNOSIS — T782XXA Anaphylactic shock, unspecified, initial encounter: Secondary | ICD-10-CM | POA: Diagnosis not present

## 2019-01-29 DIAGNOSIS — X58XXXA Exposure to other specified factors, initial encounter: Secondary | ICD-10-CM | POA: Diagnosis not present

## 2019-01-29 DIAGNOSIS — T7840XA Allergy, unspecified, initial encounter: Secondary | ICD-10-CM | POA: Diagnosis not present

## 2019-02-07 DIAGNOSIS — Z91010 Allergy to peanuts: Secondary | ICD-10-CM | POA: Diagnosis not present

## 2019-02-07 DIAGNOSIS — Z6825 Body mass index (BMI) 25.0-25.9, adult: Secondary | ICD-10-CM | POA: Diagnosis not present

## 2019-02-07 DIAGNOSIS — E663 Overweight: Secondary | ICD-10-CM | POA: Diagnosis not present

## 2019-02-07 DIAGNOSIS — Z1389 Encounter for screening for other disorder: Secondary | ICD-10-CM | POA: Diagnosis not present

## 2019-02-13 DIAGNOSIS — M62838 Other muscle spasm: Secondary | ICD-10-CM | POA: Diagnosis not present

## 2019-02-13 DIAGNOSIS — Z981 Arthrodesis status: Secondary | ICD-10-CM | POA: Diagnosis not present

## 2019-02-13 DIAGNOSIS — I10 Essential (primary) hypertension: Secondary | ICD-10-CM | POA: Diagnosis not present

## 2019-02-13 DIAGNOSIS — M47816 Spondylosis without myelopathy or radiculopathy, lumbar region: Secondary | ICD-10-CM | POA: Diagnosis not present

## 2019-03-06 DIAGNOSIS — Z981 Arthrodesis status: Secondary | ICD-10-CM | POA: Diagnosis not present

## 2019-03-06 DIAGNOSIS — M5136 Other intervertebral disc degeneration, lumbar region: Secondary | ICD-10-CM | POA: Diagnosis not present

## 2019-03-06 DIAGNOSIS — M5416 Radiculopathy, lumbar region: Secondary | ICD-10-CM | POA: Diagnosis not present

## 2019-03-06 DIAGNOSIS — M4726 Other spondylosis with radiculopathy, lumbar region: Secondary | ICD-10-CM | POA: Diagnosis not present

## 2019-04-18 DIAGNOSIS — L820 Inflamed seborrheic keratosis: Secondary | ICD-10-CM | POA: Diagnosis not present

## 2019-04-18 DIAGNOSIS — L570 Actinic keratosis: Secondary | ICD-10-CM | POA: Diagnosis not present

## 2019-04-18 DIAGNOSIS — C44712 Basal cell carcinoma of skin of right lower limb, including hip: Secondary | ICD-10-CM | POA: Diagnosis not present

## 2019-04-18 DIAGNOSIS — D224 Melanocytic nevi of scalp and neck: Secondary | ICD-10-CM | POA: Diagnosis not present

## 2019-04-24 ENCOUNTER — Other Ambulatory Visit: Payer: Self-pay | Admitting: *Deleted

## 2019-04-24 DIAGNOSIS — R6889 Other general symptoms and signs: Secondary | ICD-10-CM | POA: Diagnosis not present

## 2019-04-24 DIAGNOSIS — Z20822 Contact with and (suspected) exposure to covid-19: Secondary | ICD-10-CM

## 2019-04-25 LAB — NOVEL CORONAVIRUS, NAA: SARS-CoV-2, NAA: NOT DETECTED

## 2019-06-01 DIAGNOSIS — Z981 Arthrodesis status: Secondary | ICD-10-CM | POA: Diagnosis not present

## 2019-06-07 DIAGNOSIS — Z23 Encounter for immunization: Secondary | ICD-10-CM | POA: Diagnosis not present

## 2019-06-07 DIAGNOSIS — D510 Vitamin B12 deficiency anemia due to intrinsic factor deficiency: Secondary | ICD-10-CM | POA: Diagnosis not present

## 2019-06-14 ENCOUNTER — Ambulatory Visit: Payer: BC Managed Care – PPO | Admitting: Physical Therapy

## 2019-06-18 ENCOUNTER — Ambulatory Visit: Payer: BC Managed Care – PPO | Attending: Neurosurgery

## 2019-06-18 ENCOUNTER — Other Ambulatory Visit: Payer: Self-pay

## 2019-06-18 DIAGNOSIS — R252 Cramp and spasm: Secondary | ICD-10-CM | POA: Diagnosis not present

## 2019-06-18 DIAGNOSIS — M6281 Muscle weakness (generalized): Secondary | ICD-10-CM | POA: Diagnosis not present

## 2019-06-18 NOTE — Patient Instructions (Signed)
Access Code: [email or web address]  URL: [email or web address]  Date: 06/18/2019  Prepared by: Sigurd Sos   Exercises Hooklying Single Knee to Chest - 3 reps - 1 sets - 20 hold - 3x daily - 7x weekly Seated Hamstring Stretch - 3 reps - 1 sets - 20 hold - 3x daily - 7x weekly Seated Figure 4 Piriformis Stretch - 3 reps - 1 sets - 20 hold - 3x daily - 7x weekly Clamshell - 10 reps - 2 sets - 2x daily - 7x weekly Sit to Stand without Arm Support - 10 reps - 1 sets - 2x daily - 7x weekly Patient Education Biomedical scientist

## 2019-06-18 NOTE — Therapy (Signed)
[facility] [address] [place], [place], [address] Phone: [phone]   Fax:  [phone]  Physical Therapy Evaluation  Patient Details  Name: [name] MRN: [id number] Date of Birth: 1960-09-11 Referring Provider (PT): Jovita Gamma, MD   Encounter Date: 06/18/2019  PT End of Session - 06/18/19 1307    Visit Number  1    Date for PT Re-Evaluation  07/30/19    Authorization Type  [employer]-30 visits    Authorization - Visit Number  1    Authorization - Number of Visits  30    PT Start Time  1223    PT Stop Time  1311    PT Time Calculation (min)  48 min    Activity Tolerance  Patient tolerated treatment well    Behavior During Therapy  [facility] for tasks assessed/performed       Past Medical History:  Diagnosis Date  . Asthma   . Chronic back pain   . History of cardiac catheterization 2011   Reportedly normal coronary arteries - [facility]  . History of kidney stones   . History of skin cancer   . Hypertension   . Hypothyroidism   . Hypothyroidism   . Mitral valve prolapse   . Urinary incontinence 08/12/2015    Past Surgical History:  Procedure Laterality Date  . ABDOMINAL HYSTERECTOMY    . BACK SURGERY     x3-ruptured disc  . Bone spur Right    Foot  . CARDIAC CATHETERIZATION  11/2009   Normal coronary arteries ([facility])  . CHOLECYSTECTOMY    . COLONOSCOPY N/A 07/15/2014   Procedure: COLONOSCOPY;  Surgeon: Daneil Dolin, MD;  [facility];  Service: Endoscopy;  Laterality: N/A;  . CYST REMOVAL HAND Right   . KNEE ARTHROSCOPY Left     There were no vitals filed for this visit.   Subjective Assessment - 06/18/19 1225    Subjective  I had surgery 05/13/2018 to remove hardware and add screws due to instability.  Pt has history of 4 lumbar surgeries. I have difficulty getting off the floor, I feel weak and I get out of breath when I walk.    Pertinent History  4 lumbar surgeries- most  recent 05/14/2019 for hardware removal and addition of screws    How long can you walk comfortably?  walking limited to 35-40 minutes- fatigue    Patient Stated Goals  improve endurance and strength, perform housework without pain    Currently in Pain?  No/denies   no pain- just tightness.        [facility] PT Assessment - 06/18/19 0001      Assessment   Medical Diagnosis  arthrodesis status    removal of hardware and screws placed    Referring Provider (PT)  Jovita Gamma, MD    Onset Date/Surgical Date  05/14/19    Next MD Visit  none      Precautions   Precautions  None      Restrictions   Weight Bearing Restrictions  No      Balance Screen   Has the patient fallen in the past 6 months  No    Has the patient had a decrease in activity level because of a fear of falling?   No    Is the patient reluctant to leave their home because of a fear of falling?   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  Hip flexibility limited by 50%       PROM    Overall PROM   Deficits    Overall PROM Comments  hip flexibility limited by 50% in all directions      Strength   Overall Strength  Deficits    Overall Strength Comments  poor abdominal recruitment     Strength Assessment Site  Hip;Knee    Right/Left Hip  Right;Left    Right Hip Flexion  4/5    Right Hip Extension  4-/5    Right Hip ABduction  4/5    Left Hip Flexion  4/5    Left Hip Extension  4-/5    Left Hip ABduction  4/5    Right/Left Knee  Right;Left    Right Knee Flexion  4-/5    Right Knee Extension  5/5    Left Knee Flexion  4-/5    Left Knee Extension  5/5      Palpation   Palpation comment  tension and trigger points over bil gluteals  and lumbar paraspinals      Transfers   Transfers  Sit to Stand;Stand to Sit    Stand to Sit  With upper extremity assist    Comments  difficulty with sit to stand and demonstrates reduced eccentric control      Ambulation/Gait   Ambulation/Gait  Yes    Gait Pattern  Within Functional Limits                Objective measurements completed on examination: See above findings.              PT Education - 06/18/19 1304    Education Details  Access Code: [email or web address]    Person(s) Educated  Patient    Methods  Explanation;Demonstration;Handout    Comprehension  Verbalized understanding;Returned demonstration       PT Short Term Goals - 06/18/19 1402      PT SHORT TERM GOAL #1   Title  be independent in initial HEP    Time  3    Period  Weeks    Status  New    Target Date  07/09/19      PT SHORT TERM GOAL #2   Title  verbalize and demonstrate body mechanics modifications for lumbar protection with repetative work tasks    Time  3    Period  Weeks    Status  New    Target Date  07/09/19      PT SHORT TERM GOAL #3   Title  improve LE strenth to perform sit to stand without UE support and demonstrate good eccentric control with stand to sit transition    Time  3    Period  Weeks    Status  New    Target  Date  07/09/19        PT Long Term Goals - 06/18/19 1404      PT LONG TERM GOAL #1   Title  be independent in advanced HEP    Time  6    Period  Weeks    Status  New    Target Date  07/30/19      PT LONG TERM GOAL #2   Title  reduce FOTO to < or = to 48% limitation    Time  6    Period  Weeks    Status  New    Target Date  07/30/19      PT LONG TERM GOAL #3   Title  demonstrate 4+/5 bil hip and knee strength to improve endurance for community ambulation and houswork    Time  6    Period  Weeks    Status  New    Target Date  07/30/19      PT LONG TERM GOAL #4   Title  report 50% increased ease with house cleaning tasks due to improved core strength and endurance    Time  6    Period  Weeks    Status  New    Target Date  07/30/19             Plan - 06/18/19 1323    Clinical Impression Statement  Pt presents to PT from Dr Sherwood Gambler for lumbar/core strengthening and progression to a HEP.  Pt has a history of 4 lumbar surgeries.  Pt with main complaint of reduced endurance with walking long distances, weakness with sit to stand and fatigue with housework.  Pt denies any pain at this time.  Pt demonstrates lumbar A/ROM WFLs with decreased segmental mobility in the lower spine due to fusion.  Pt with 50% reduction in hip flexibility without pain.  Pt demonstrates 4/5 bil hamstring strength and 4/5 hip abduction and flexion, 4-/5 extension with poor abdominal recruitment with bracing.  Pt will benefit from skilled PT for dry needling to address hip stiffness, core and hip/LE strength to improve function and endurance and body mechanics education for home and work tasks.    Personal Factors and Comorbidities  Comorbidity 1    Comorbidities  history of lumbar surgeries    Examination-Activity Limitations  Lift;Squat;Locomotion Level;Transfers    Examination-Participation Restrictions  Laundry;Cleaning;Community Activity    Stability/Clinical Decision Making  Stable/Uncomplicated     Clinical Decision Making  Low    Rehab Potential  Excellent    PT Frequency  2x / week    PT Duration  6 weeks    PT Treatment/Interventions  ADLs/Self Care Home Management;Cryotherapy;Moist Heat;Therapeutic exercise;Therapeutic activities;Functional mobility training;Neuromuscular re-education;Patient/family education;Passive range of motion;Dry needling;Manual techniques;Taping    PT Next Visit Plan  dry needling to bil gluteals, review HEP, LE strength, work on transfers, TA activation    PT Home Exercise Plan  Access Code: [email or web address]    Consulted and Agree with Plan of Care  Patient  Patient will benefit from skilled therapeutic intervention in order to improve the following deficits and impairments:  Decreased activity tolerance, Decreased endurance, Decreased range of motion, Decreased strength, Increased muscle spasms, Impaired flexibility, Postural dysfunction, Improper body mechanics  Visit Diagnosis: Muscle weakness (generalized) - Plan: PT plan of care cert/re-cert  Cramp and spasm - Plan: PT plan of care cert/re-cert     Problem List Patient Active Problem List   Diagnosis Date Noted  . Encounter for well woman exam with routine gynecological exam 09/13/2018  . 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 Well woman exam with routine gynecological exam 08/30/2016  . Burning with urination 08/30/2016  . Hot flashes 08/30/2016  . Hematuria 08/30/2016  . Vaginal Pap smear following hysterectomy for malignancy 08/30/2016  . Screening for colorectal cancer 08/30/2016  . Mild intermittent asthma 12/15/2015  . Allergic rhinitis due to pollen 12/15/2015  . 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 Hypertension 08/12/2015  . Urinary incontinence 08/12/2015  . Special screening for malignant neoplasms, colon      Sigurd Sos, PT 06/18/19 2:09 PM  [facility] [address], [place], [place], [address] Phone: [phone]   Fax:  [phone]  Name: LOETTE FAUERBACH MRN: [id number] Date of Birth: 01-24-1961

## 2019-06-25 ENCOUNTER — Ambulatory Visit: Payer: BC Managed Care – PPO | Admitting: Physical Therapy

## 2019-06-25 ENCOUNTER — Other Ambulatory Visit: Payer: Self-pay

## 2019-06-25 ENCOUNTER — Encounter: Payer: Self-pay | Admitting: Physical Therapy

## 2019-06-25 DIAGNOSIS — M6281 Muscle weakness (generalized): Secondary | ICD-10-CM | POA: Diagnosis not present

## 2019-06-25 DIAGNOSIS — R252 Cramp and spasm: Secondary | ICD-10-CM | POA: Diagnosis not present

## 2019-06-25 NOTE — Therapy (Signed)
[facility] [address], [place], [place], [address] Phone: [phone]   Fax:  [phone]  Physical Therapy Treatment  Patient Details  Name: [name] MRN: [id number] Date of Birth: 1960/09/22 Referring Provider (PT): Jovita Gamma, MD   Encounter Date: 06/25/2019  PT End of Session - 06/25/19 1230    Visit Number  2    Date for PT Re-Evaluation  07/30/19    Authorization Type  BCBS-30 visits    Authorization - Visit Number  2    Authorization - Number of Visits  30    PT Start Time  1230    PT Stop Time  1306    PT Time Calculation (min)  36 min    Activity Tolerance  Patient tolerated treatment well    Behavior During Therapy  [facility] for tasks assessed/performed       Past Medical History:  Diagnosis Date  . Asthma   . Chronic back pain   . History of cardiac catheterization 2011   Reportedly normal coronary arteries - [facility]  . History of kidney stones   . History of skin cancer   . Hypertension   . Hypothyroidism   . Hypothyroidism   . Mitral valve prolapse   . Urinary incontinence 08/12/2015    Past Surgical History:  Procedure Laterality Date  . ABDOMINAL HYSTERECTOMY    . BACK SURGERY     x3-ruptured disc  . Bone spur Right    Foot  . CARDIAC CATHETERIZATION  11/2009   Normal coronary arteries ([facility])  . CHOLECYSTECTOMY    . COLONOSCOPY N/A 07/15/2014   Procedure: COLONOSCOPY;  Surgeon: Daneil Dolin, MD;  [facility];  Service: Endoscopy;  Laterality: N/A;  . CYST REMOVAL HAND Right   . KNEE ARTHROSCOPY Left     There were no vitals filed for this visit.  Subjective Assessment - 06/25/19 1232    Subjective  I am doing my HEP at least 1 x, sometimes 2x.    Pertinent History  4 lumbar surgeries- most recent 05/14/2019 for hardware removal and addition of screws    Currently in Pain?  No/denies    Multiple Pain Sites  No                        [facility] Adult PT Treatment/Exercise - 06/25/19 0001      Self-Care   Self-Care  Posture    Posture  Review of lumbopelvic protective body mechanics and posture: discussed moving feet vs twisitng and bending when vacuuming/mopping.      Lumbar Exercises: Stretches   Active Hamstring Stretch  Right;Left;2 reps;20 seconds    Single Knee to Chest Stretch  Right;Left;1 rep;20 seconds    Figure 4 Stretch  3 reps;20 seconds;With overpressure      Lumbar Exercises: Aerobic   Nustep  L2 x 6 min to end session       Lumbar Exercises: Seated   Sit to Stand  10 reps      Lumbar Exercises: Supine   Bridge  10 reps;2 seconds    Bridge Limitations  VC to lower slowlly, added to HEP    Straight Leg Raise  10 reps    Straight Leg Raises Limitations  Vc to contract core first then lift leg, added to HEP      Lumbar Exercises: Sidelying   Clam  Both;10 reps    Clam Limitations  VC/TC to align  body properly               PT Short Term Goals - 06/25/19 1253      PT SHORT TERM GOAL #1   Title  be independent in initial HEP    Time  3    Status  Achieved    Target Date  07/09/19      PT SHORT TERM GOAL #3   Title  improve LE strenth to perform sit to stand without UE support and demonstrate good eccentric control with stand to sit transition    Time  3    Period  Weeks    Status  Achieved    Target Date  07/09/19        PT Long Term Goals - 06/18/19 1404      PT LONG TERM GOAL #1   Title  be independent in advanced HEP    Time  6    Period  Weeks    Status  New    Target Date  07/30/19      PT LONG TERM GOAL #2   Title  reduce FOTO to < or = to 48% limitation    Time  6    Period  Weeks    Status  New    Target Date  07/30/19      PT LONG TERM GOAL #3   Title  demonstrate 4+/5 bil hip and knee strength to improve endurance for community ambulation and houswork    Time  6    Period  Weeks    Status  New    Target Date  07/30/19       PT LONG TERM GOAL #4   Title  report 50% increased ease with house cleaning tasks due to improved core strength and endurance    Time  6    Period  Weeks    Status  New    Target Date  07/30/19            Plan - 06/25/19 1233    Clinical Impression Statement  Pt arrives painfree to therapy. She is independent and compliant ( 1-2x day) with her initial HEP, meeting STG. She can now arise from sitting position with only using her LE, no UE, meeting goal. Added supine bridge and supine SLR with core focus to her HEP to progress LE and core strength. Pt appears to have a good handle on lumbar protective body mechanics.    Personal Factors and Comorbidities  Comorbidity 1    Comorbidities  history of lumbar surgeries    Examination-Activity Limitations  Lift;Squat;Locomotion Level;Transfers    Examination-Participation Restrictions  Laundry;Cleaning;Community Activity    Stability/Clinical Decision Making  Stable/Uncomplicated    Rehab Potential  Excellent    PT Frequency  2x / week    PT Duration  6 weeks    PT Treatment/Interventions  ADLs/Self Care Home Management;Cryotherapy;Moist Heat;Therapeutic exercise;Therapeutic activities;Functional mobility training;Neuromuscular re-education;Patient/family education;Passive range of motion;Dry needling;Manual techniques;Taping    PT Next Visit Plan  Begin standing core stabilization with LE strength to simulate standing work tasks.    PT Home Exercise Plan  Access Code: [email or web address]       Patient will benefit from skilled therapeutic intervention in order to improve the following deficits and impairments:  Decreased activity tolerance, Decreased endurance, Decreased range of motion, Decreased strength, Increased muscle spasms, Impaired flexibility, Postural dysfunction, Improper body mechanics  Visit Diagnosis: Muscle weakness (generalized)  Cramp and spasm  Problem List Patient Active Problem List   Diagnosis Date Noted  .  Encounter for well woman exam with routine gynecological exam 09/13/2018  . 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 Well woman exam with routine gynecological exam 08/30/2016  . Burning with urination 08/30/2016  . Hot flashes 08/30/2016  . Hematuria 08/30/2016  . Vaginal Pap smear following hysterectomy for malignancy 08/30/2016  . Screening for colorectal cancer 08/30/2016  . Mild intermittent asthma 12/15/2015  . Allergic rhinitis due to pollen 12/15/2015  . 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 Hypertension 08/12/2015  . Urinary incontinence 08/12/2015  . Special screening for malignant neoplasms, colon     Thoren Hosang, PTA 06/25/2019, 1:06 PM  [facility] [address] [place], [place], [address] Phone: [phone]   Fax:  [phone]  Name: [name] MRN: [id number] Date of Birth: [birth year]  Access Code: [email or web address]  URL: [email or web address]  Date: 06/25/2019  Prepared by: Myrene Galas   Exercises  Hooklying Single Knee to Chest - 3 reps - 1 sets - 20 hold - 2x daily - 7x weekly  Seated Hamstring Stretch - 3 reps - 1 sets - 20 hold - 2x daily - 7x weekly  Seated Figure 4 Piriformis Stretch - 3 reps - 1 sets - 20 hold - 2x daily - 7x weekly  Clamshell - 10 reps - 2 sets - 2x daily - 7x weekly  Sit to Stand without Arm Support - 10 reps - 1 sets - 2x daily - 7x weekly  Supine Bridge - 10 reps - 1 sets - 5 hold - 1x daily - 7x weekly  Supine Straight Leg Raises - 10 reps - 1 sets - 1x daily - 7x weekly  Patient Education  Biomedical scientist

## 2019-06-29 ENCOUNTER — Encounter: Payer: Self-pay | Admitting: Physical Therapy

## 2019-06-29 ENCOUNTER — Ambulatory Visit: Payer: BC Managed Care – PPO | Attending: Neurosurgery | Admitting: Physical Therapy

## 2019-06-29 ENCOUNTER — Other Ambulatory Visit: Payer: Self-pay

## 2019-06-29 DIAGNOSIS — R252 Cramp and spasm: Secondary | ICD-10-CM | POA: Insufficient documentation

## 2019-06-29 DIAGNOSIS — M6281 Muscle weakness (generalized): Secondary | ICD-10-CM | POA: Insufficient documentation

## 2019-06-29 NOTE — Therapy (Signed)
[facility] [address], [place], [place], [address] Phone: [phone]   Fax:  [phone]  Physical Therapy Treatment  Patient Details  Name: MISSEY MCCRIGHT MRN: [id number] Date of Birth: [birth year] Referring Provider (PT): Jovita Gamma, MD   Encounter Date: 06/29/2019  PT End of Session - 06/29/19 0923    Visit Number  3    Date for PT Re-Evaluation  07/30/19    Authorization Type  BCBS-30 visits    Authorization - Visit Number  3    Authorization - Number of Visits  30    PT Start Time  [id number]    PT Stop Time  1008    PT Time Calculation (min)  45 min    Activity Tolerance  Patient tolerated treatment well    Behavior During Therapy  [facility] for tasks assessed/performed       Past Medical History:  Diagnosis Date  . Asthma   . Chronic back pain   . History of cardiac catheterization 2011   Reportedly normal coronary arteries - [facility]  . History of kidney stones   . History of skin cancer   . Hypertension   . Hypothyroidism   . Hypothyroidism   . Mitral valve prolapse   . Urinary incontinence 08/12/2015    Past Surgical History:  Procedure Laterality Date  . ABDOMINAL HYSTERECTOMY    . BACK SURGERY     x3-ruptured disc  . Bone spur Right    Foot  . CARDIAC CATHETERIZATION  11/2009   Normal coronary arteries ([facility])  . CHOLECYSTECTOMY    . COLONOSCOPY N/A 07/15/2014   Procedure: COLONOSCOPY;  Surgeon: Daneil Dolin, MD;  [facility];  Service: Endoscopy;  Laterality: N/A;  . CYST REMOVAL HAND Right   . KNEE ARTHROSCOPY Left     There were no vitals filed for this visit.  Subjective Assessment - 06/29/19 0924    Subjective  Very busy at work so getting HEP done 1x day. I am feeling really good, no pain, stronger in my legs.    Pertinent History  4 lumbar surgeries- most recent 05/14/2019 for hardware removal and addition of screws    Currently in Pain?  No/denies    Multiple Pain  Sites  No                       [facility] Adult PT Treatment/Exercise - 06/29/19 0001      Lumbar Exercises: Stretches   Single Knee to Chest Stretch  Right;Left;3 reps;20 seconds    Figure 4 Stretch  3 reps;20 seconds;With overpressure      Lumbar Exercises: Aerobic   Nustep  L2 x 7 min with discussion of status      Lumbar Exercises: Supine   Bridge  10 reps;4 seconds    Straight Leg Raise  10 reps   Bil     Lumbar Exercises: Sidelying   Clam  Both;10 reps    Clam Limitations  red band    Hip Abduction  Both;10 reps    Hip Abduction Limitations  Added to HEP today             PT Education - 06/29/19 1008    Education Details  progress HEP, see Med[employer]    Person(s) Educated  Patient    Methods  Explanation;Verbal cues;Handout    Comprehension  Returned demonstration;Verbalized understanding       PT Short Term Goals - 06/25/19  [place] #1   Title  be independent in initial HEP    Time  3    Status  Achieved    Target Date  07/09/19      PT SHORT TERM GOAL #3   Title  improve LE strenth to perform sit to stand without UE support and demonstrate good eccentric control with stand to sit transition    Time  3    Period  Weeks    Status  Achieved    Target Date  07/09/19        PT Long Term Goals - 06/18/19 1404      PT LONG TERM GOAL #1   Title  be independent in advanced HEP    Time  6    Period  Weeks    Status  New    Target Date  07/30/19      PT LONG TERM GOAL #2   Title  reduce FOTO to < or = to 48% limitation    Time  6    Period  Weeks    Status  New    Target Date  07/30/19      PT LONG TERM GOAL #3   Title  demonstrate 4+/5 bil hip and knee strength to improve endurance for community ambulation and houswork    Time  6    Period  Weeks    Status  New    Target Date  07/30/19      PT LONG TERM GOAL #4   Title  report 50% increased ease with house cleaning tasks due to improved core strength and  endurance    Time  6    Period  Weeks    Status  New    Target Date  07/30/19            Plan - 06/29/19 [id number]    Clinical Impression Statement  Pt continues to be compliant with her HEP 1x day. She reports she "feels better, stronger." Pt demonstrates improving ability to engage her core muscles during her exercises. Today we discussed adding in some standing exercises she can do while at work to either help change positions of her legs and move in a different way or just get some extra stimulation of her muscles. She felt this was a good idea and rpeorts she is very good about changing her positions throughout the day.    Personal Factors and Comorbidities  Comorbidity 1    Comorbidities  history of lumbar surgeries    Examination-Activity Limitations  Lift;Squat;Locomotion Level;Transfers    Examination-Participation Restrictions  Laundry;Cleaning;Community Activity    Stability/Clinical Decision Making  Stable/Uncomplicated    Rehab Potential  Excellent    PT Frequency  2x / week    PT Duration  6 weeks    PT Treatment/Interventions  ADLs/Self Care Home Management;Cryotherapy;Moist Heat;Therapeutic exercise;Therapeutic activities;Functional mobility training;Neuromuscular re-education;Patient/family education;Passive range of motion;Dry needling;Manual techniques;Taping    PT Next Visit Plan  Pt may want to hold on trying dry needing. Continue to progress hip and LE strength, consider MMT to compare to eval.    PT Home Exercise Plan  Access Code: [email or web address]    Consulted and Agree with Plan of Care  Patient       Patient will benefit from skilled therapeutic intervention in order to improve the following deficits and impairments:  Decreased activity tolerance, Decreased endurance, Decreased range of motion, Decreased strength, Increased muscle spasms,  Impaired flexibility, Postural dysfunction, Improper body mechanics  Visit Diagnosis: Muscle weakness (generalized)  Cramp and  spasm     Problem List Patient Active Problem List   Diagnosis Date Noted  . Encounter for well woman exam with routine gynecological exam 09/13/2018  . 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 Well woman exam with routine gynecological exam 08/30/2016  . Burning with urination 08/30/2016  . Hot flashes 08/30/2016  . Hematuria 08/30/2016  . Vaginal Pap smear following hysterectomy for malignancy 08/30/2016  . Screening for colorectal cancer 08/30/2016  . Mild intermittent asthma 12/15/2015  . Allergic rhinitis due to pollen 12/15/2015  . 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 Hypertension 08/12/2015  . Urinary incontinence 08/12/2015  . Special screening for malignant neoplasms, colon     Adria Costley, PTA 06/29/2019, 10:13 AM  [facility] [address], [place], [place], [address] Phone: [phone]   Fax:  [phone]  Name: SUAN SILLMAN MRN: [id number] Date of Birth: 02/22/1961  Access Code: [email or web address]  URL: [email or web address]  Date: 06/29/2019  Prepared by: Myrene Galas   Exercises  Hooklying Single Knee to Chest - 3 reps - 1 sets - 20 hold - 2x daily - 7x weekly  Seated Hamstring Stretch - 3 reps - 1 sets - 20 hold - 2x daily - 7x weekly  Seated Figure 4 Piriformis Stretch - 3 reps - 1 sets - 20 hold - 2x daily - 7x weekly  Clamshell - 10 reps - 2 sets - 2x daily - 7x weekly  Sit to Stand without Arm Support - 10 reps - 1 sets - 2x daily - 7x weekly  Supine Bridge - 10 reps - 1 sets - 5 hold - 1x daily - 7x weekly  Supine Straight Leg Raises - 10 reps - 1 sets - 1x daily - 7x weekly  Sidelying Hip Abduction - 10 reps - 1 sets - 1x daily - 7x weekly  Prone Hip Extension - Two Pillows - 10 reps - 1 sets - 1x daily - 7x weekly  Standing Heel Raises - 10 reps - 7x weekly  Standing Hip Abduction with Anterior Support - 1x daily - 7x weekly  Patient  Education  Biomedical scientist

## 2019-07-02 ENCOUNTER — Ambulatory Visit: Payer: BC Managed Care – PPO

## 2019-07-02 DIAGNOSIS — R252 Cramp and spasm: Secondary | ICD-10-CM | POA: Diagnosis not present

## 2019-07-02 DIAGNOSIS — M6281 Muscle weakness (generalized): Secondary | ICD-10-CM | POA: Diagnosis not present

## 2019-07-02 NOTE — Therapy (Addendum)
[facility] [address] [place], [place], [address] Phone: [phone]   Fax:  [phone]  Physical Therapy Treatment  Patient Details  Name: [name] MRN: [id number] Date of Birth: September 18, 1960 Referring Provider (PT): Jovita Gamma, MD   Encounter Date: 07/02/2019  PT End of Session - 07/02/19 1311    Visit Number  4    Date for PT Re-Evaluation  07/30/19    Authorization Type  BCBS-30 visits    Authorization - Visit Number  4    Authorization - Number of Visits  30    PT Start Time  0263    PT Stop Time  1313    PT Time Calculation (min)  42 min    Activity Tolerance  Patient tolerated treatment well    Behavior During Therapy  [facility] for tasks assessed/performed       Past Medical History:  Diagnosis Date  . Asthma   . Chronic back pain   . History of cardiac catheterization 2011   Reportedly normal coronary arteries - [facility]  . History of kidney stones   . History of skin cancer   . Hypertension   . Hypothyroidism   . Hypothyroidism   . Mitral valve prolapse   . Urinary incontinence 08/12/2015    Past Surgical History:  Procedure Laterality Date  . ABDOMINAL HYSTERECTOMY    . BACK SURGERY     x3-ruptured disc  . Bone spur Right    Foot  . CARDIAC CATHETERIZATION  11/2009   Normal coronary arteries ([facility])  . CHOLECYSTECTOMY    . COLONOSCOPY N/A 07/15/2014   Procedure: COLONOSCOPY;  Surgeon: Daneil Dolin, MD;  [facility];  Service: Endoscopy;  Laterality: N/A;  . CYST REMOVAL HAND Right   . KNEE ARTHROSCOPY Left     There were no vitals filed for this visit.  Subjective Assessment - 07/02/19 1212    Subjective  I tied my shoes on top today.  I haven't been able to do that for a long time.    Currently in Pain?  No/denies                       [facility] Adult PT Treatment/Exercise - 07/02/19 0001      Lumbar Exercises: Stretches   Active Hamstring  Stretch  Right;Left;20 seconds;4 reps    Single Knee to Chest Stretch  Right;Left;3 reps;20 seconds      Lumbar Exercises: Aerobic   Nustep  L2 x 10 minutes- arms and legs      Lumbar Exercises: Supine   Bridge  20 reps    Straight Leg Raise  10 reps   Bil     Lumbar Exercises: Sidelying   Clam  Both;10 reps    Clam Limitations  red band    Hip Abduction  Both;15 reps               PT Short Term Goals - 07/02/19 1235      PT SHORT TERM GOAL #2   Title  verbalize and demonstrate body mechanics modifications for lumbar protection with repetative work tasks    Status  Achieved      PT SHORT TERM GOAL #3   Title  improve LE strenth to perform sit to stand without UE support and demonstrate good eccentric control with stand to sit transition    Status  Achieved        PT Long  Term Goals - 06/18/19 1404      PT LONG TERM GOAL #1   Title  be independent in advanced HEP    Time  6    Period  Weeks    Status  New    Target Date  07/30/19      PT LONG TERM GOAL #2   Title  reduce FOTO to < or = to 48% limitation    Time  6    Period  Weeks    Status  New    Target Date  07/30/19      PT LONG TERM GOAL #3   Title  demonstrate 4+/5 bil hip and knee strength to improve endurance for community ambulation and houswork    Time  6    Period  Weeks    Status  New    Target Date  07/30/19      PT LONG TERM GOAL #4   Title  report 50% increased ease with house cleaning tasks due to improved core strength and endurance    Time  6    Period  Weeks    Status  New    Target Date  07/30/19            Plan - 07/02/19 1250    Clinical Impression Statement  Pt is making excellent progress with strength and flexibility.  Pt is making body mechanics modifications with repetitive work tasks and with home tasks.  Pt has a comprehensive HEP for strength and flexibility and is becoming more consistent with compliance.  Pt demonstrates improving ability to engage her core  muscles during her exercises in the clinic today.  Pt has not worked since last session and has not been able to incorporate standing exercises at work.  Pt will continue to benefit from skilled PT for strength, flexibility and tissue mobilization as needed.    PT Frequency  2x / week    PT Duration  6 weeks    PT Treatment/Interventions  ADLs/Self Care Home Management;Cryotherapy;Moist Heat;Therapeutic exercise;Therapeutic activities;Functional mobility training;Neuromuscular re-education;Patient/family education;Passive range of motion;Dry needling;Manual techniques;Taping    PT Next Visit Plan  MMT for goal assessment, core engagement, flexibility    PT Home Exercise Plan  Access Code: [email or web address]    Consulted and Agree with Plan of Care  Patient       Patient will benefit from skilled therapeutic intervention in order to improve the following deficits and impairments:  Decreased activity tolerance, Decreased endurance, Decreased range of motion, Decreased strength, Increased muscle spasms, Impaired flexibility, Postural dysfunction, Improper body mechanics  Visit Diagnosis: Cramp and spasm  Muscle weakness (generalized)     Problem List Patient Active Problem List   Diagnosis Date Noted  . Encounter for well woman exam with routine gynecological exam 09/13/2018  . 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 Well woman exam with routine gynecological exam 08/30/2016  . Burning with urination 08/30/2016  . Hot flashes 08/30/2016  . Hematuria 08/30/2016  . Vaginal Pap smear following hysterectomy for malignancy 08/30/2016  . Screening for colorectal cancer 08/30/2016  . Mild intermittent asthma 12/15/2015  . Allergic rhinitis due to pollen 12/15/2015  . 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 Hypertension 08/12/2015  . Urinary incontinence 08/12/2015  . Special screening for malignant neoplasms, colon     Sigurd Sos, PT 07/02/19 1:13 PM PHYSICAL  THERAPY DISCHARGE SUMMARY  Visits from Start of Care: 4  Current functional level related to goals /  functional outcomes: See above for current PT status.  Pt didn't return to PT   Remaining deficits: See above.    Education / Equipment: HEP Plan: Patient agrees to discharge.  Patient goals were not met. Patient is being discharged due to not returning since the last visit.  ?????        Sigurd Sos, PT 09/19/19 4:34 PM  [facility] [address], [place], [place], [address] Phone: [phone]   Fax:  [phone]  Name: CASSANDRE OLEKSY MRN: [id number] Date of Birth: 03-07-1961

## 2019-07-06 ENCOUNTER — Encounter: Payer: BLUE CROSS/BLUE SHIELD | Admitting: Physical Therapy

## 2019-07-11 ENCOUNTER — Encounter: Payer: Self-pay | Admitting: Internal Medicine

## 2019-07-13 ENCOUNTER — Encounter: Payer: BLUE CROSS/BLUE SHIELD | Admitting: Physical Therapy

## 2019-07-31 DIAGNOSIS — L821 Other seborrheic keratosis: Secondary | ICD-10-CM | POA: Diagnosis not present

## 2019-07-31 DIAGNOSIS — D2261 Melanocytic nevi of right upper limb, including shoulder: Secondary | ICD-10-CM | POA: Diagnosis not present

## 2019-07-31 DIAGNOSIS — D225 Melanocytic nevi of trunk: Secondary | ICD-10-CM | POA: Diagnosis not present

## 2019-07-31 DIAGNOSIS — L570 Actinic keratosis: Secondary | ICD-10-CM | POA: Diagnosis not present

## 2019-07-31 DIAGNOSIS — Z85828 Personal history of other malignant neoplasm of skin: Secondary | ICD-10-CM | POA: Diagnosis not present

## 2019-08-13 ENCOUNTER — Ambulatory Visit: Payer: BC Managed Care – PPO

## 2019-08-31 DIAGNOSIS — Z6823 Body mass index (BMI) 23.0-23.9, adult: Secondary | ICD-10-CM | POA: Diagnosis not present

## 2019-08-31 DIAGNOSIS — M25512 Pain in left shoulder: Secondary | ICD-10-CM | POA: Diagnosis not present

## 2019-09-18 ENCOUNTER — Ambulatory Visit (INDEPENDENT_AMBULATORY_CARE_PROVIDER_SITE_OTHER): Payer: BC Managed Care – PPO | Admitting: *Deleted

## 2019-09-18 ENCOUNTER — Other Ambulatory Visit: Payer: Self-pay

## 2019-09-18 DIAGNOSIS — Z1211 Encounter for screening for malignant neoplasm of colon: Secondary | ICD-10-CM

## 2019-09-18 MED ORDER — NA SULFATE-K SULFATE-MG SULF 17.5-3.13-1.6 GM/177ML PO SOLN: 1.0000 | Freq: Once | ORAL | 0 refills | Status: AC

## 2019-09-18 NOTE — Progress Notes (Signed)
Review of controlled substance database: single rx for valium just started 08/2019, prn. No other narcotics, anxiolytics.   Ok to schedule.

## 2019-09-18 NOTE — Progress Notes (Signed)
Gastroenterology Pre-Procedure Review  Request Date: 09/18/2019 Requesting Physician: 5 year recall, Last TCS done 07/15/2014 by Dr. Gala Romney, no polyps, family hx colon cancer  PATIENT REVIEW QUESTIONS: The patient responded to the following health history questions as indicated:    1. Diabetes Melitis: no 2. Joint replacements in the past 12 months: no 3. Major health problems in the past 3 months: no 4. Has an artificial valve or MVP: yes, MVP, pt says Dr. Lia Foyer diagnosed her 5. Has a defibrillator: no 6. Has been advised in past to take antibiotics in advance of a procedure like teeth cleaning: no 7. Family history of colon cancer: yes, grandmother age 62's, grandfather age 62's 8. Alcohol Use: no 9. Illicit drug Use: no 10. History of sleep apnea: no 11. History of coronary artery or other vascular stents placed within the last 12 months: no 12. History of any prior anesthesia complications: yes, severe n/v, pt says she is hard to wake up 13. There is no height or weight on file to calculate BMI. ht: 5'5 wt: 141 lbs    MEDICATIONS & ALLERGIES:    Patient reports the following regarding taking any blood thinners:   Plavix? no Aspirin? no Coumadin? no Brilinta? no Xarelto? no Eliquis? no Pradaxa? no Savaysa? no Effient? no  Patient confirms/reports the following medications:  Current Outpatient Medications  Medication Sig Dispense Refill  . acetaminophen (TYLENOL) 500 MG tablet Take 500 mg by mouth as needed.    Marland Kitchen albuterol (PROAIR HFA) 108 (90 Base) MCG/ACT inhaler Inhale 2 puffs into the lungs every 4 (four) hours as needed for wheezing or shortness of breath. 1 Inhaler 3  . celecoxib (CELEBREX) 200 MG capsule Take 200 mg by mouth daily.    . cetirizine (ZYRTEC) 10 MG tablet Take 10 mg by mouth daily.    . diazepam (VALIUM) 2 MG tablet Take 2 mg by mouth as needed for anxiety.    . diphenhydrAMINE (BENADRYL ALLERGY) 25 MG tablet Take 25 mg by mouth as needed.    Marland Kitchen  EPINEPHrine 0.3 mg/0.3 mL IJ SOAJ injection Inject 0.3 mLs (0.3 mg total) into the muscle once. 2 Device 2  . levothyroxine (SYNTHROID, LEVOTHROID) 75 MCG tablet Take 75 mcg by mouth daily before breakfast.    . loratadine (CLARITIN) 10 MG tablet Take 10 mg by mouth as needed for allergies. Rotates allergy medicines.    . montelukast (SINGULAIR) 10 MG tablet Take 10 mg by mouth daily as needed (for allergies).     . olmesartan-hydrochlorothiazide (BENICAR HCT) 40-25 MG tablet Take 1 tablet by mouth daily.     Marland Kitchen diltiazem (CARDIZEM CD) 240 MG 24 hr capsule Take 240 mg by mouth at bedtime.  2  . HYDROcodone-acetaminophen (NORCO/VICODIN) 5-325 MG tablet Take 1-2 tablets by mouth every 4 (four) hours as needed (pain). (Patient not taking: Reported on 09/18/2019) 40 tablet 0  . metoprolol tartrate (LOPRESSOR) 50 MG tablet Take 50 mg by mouth 2 (two) times daily as needed (BP higher than 140/89).   2  . naproxen sodium (ALEVE) 220 MG tablet Take 440 mg by mouth daily as needed (for pain).     No current facility-administered medications for this visit.    Patient confirms/reports the following allergies:  Allergies  Allergen Reactions  . Peanut-Containing Drug Products Hives and Shortness Of Breath  . Dilaudid [Hydromorphone Hcl] Nausea And Vomiting       . Morphine And Related Nausea And Vomiting  . Phenergan [Promethazine Hcl] Nausea And  Vomiting    No orders of the defined types were placed in this encounter.   AUTHORIZATION INFORMATION Primary Insurance: BCBS [place],  ID [id number],  Group [id number] Pre-Cert / Auth required: No, not required  SCHEDULE INFORMATION: Procedure has been scheduled as follows:  Date: 12/18/2019, Time: 8:30 Location: [facility] with Dr. Gala Romney  This Gastroenterology Pre-Precedure Review Form is being routed to the following provider(s): Neil Crouch, PA-C

## 2019-09-18 NOTE — Addendum Note (Signed)
Addended by: Mahala Menghini on: 09/18/2019 09:17 AM   Modules accepted: Orders

## 2019-09-18 NOTE — Patient Instructions (Signed)
[name]  1961-06-21 MRN: [id number]     Procedure Date: 12/18/2019 Time to register: 7:30 am Place to register: [facility] Procedure Time: 8:30 am Scheduled provider: Dr. Gala Romney    PREPARATION FOR COLONOSCOPY WITH SUPREP BOWEL PREP KIT  Note: Suprep Bowel Prep Kit is a split-dose (2day) regimen. Consumption of BOTH 6-ounce bottles is required for a complete prep.  Please notify us immediately if you are diabetic, take iron supplements, or if you are on Coumadin or any other blood thinners.  Please hold the following medications: n/a                                                                                                                                                  2 DAYS BEFORE PROCEDURE:  DATE: 12/16/2019  DAY: Sunday Begin clear liquid diet AFTER your lunch meal. NO SOLID FOODS after this point.  1 DAY BEFORE PROCEDURE:  DATE: 12/17/2019   DAY: Monday Continue clear liquids the entire day - NO SOLID FOOD.   Diabetic medications adjustments for today: n/a  At 6:00pm: Complete steps 1 through 4 below, using ONE (1) 6-ounce bottle, before going to bed. Step 1:  Pour ONE (1) 6-ounce bottle of SUPREP liquid into the mixing container.  Step 2:  Add cool drinking water to the 16 ounce line on the container and mix.  Note: Dilute the solution concentrate as directed prior to use. Step 3:  DRINK ALL the liquid in the container. Step 4:  You MUST drink an additional two (2) or more 16 ounce containers of water over the next one (1) hour.   Continue clear liquids.  DAY OF PROCEDURE:   DATE: 12/18/2019   DAY: Tuesday If you take medications for your heart, blood pressure, or breathing, you may take these medications.  Diabetic medications adjustments for today: n/a  5 hours before your procedure at 3:30 am: Step 1:  Pour ONE (1) 6-ounce bottle of SUPREP liquid into the mixing container.  Step 2:  Add cool drinking water to the 16 ounce line on the container  and mix.  Note: Dilute the solution concentrate as directed prior to use. Step 3:  DRINK ALL the liquid in the container. Step 4:  You MUST drink an additional two (2) or more 16 ounce containers of water over the next one (1) hour. You MUST complete the final glass of water at least 3 hours before your colonoscopy. Nothing by mouth past 5:30 am   You may take your morning medications with sip of water unless we have instructed otherwise.    Please see below for Dietary Information.  CLEAR LIQUIDS INCLUDE:  Water Jello (NOT red in color)   Ice Popsicles (NOT red in color)   Tea (sugar ok, no milk/cream) Powdered fruit flavored drinks  Coffee (sugar ok, no  milk/cream) Gatorade/ Lemonade/ Kool-Aid  (NOT red in color)   Juice: apple, white grape, white cranberry Soft drinks  Clear bullion, consomme, broth (fat free beef/chicken/vegetable)  Carbonated beverages (any kind)  Strained chicken noodle soup Hard Candy   Remember: Clear liquids are liquids that will allow you to see your fingers on the other side of a clear glass. Be sure liquids are NOT red in color, and not cloudy, but CLEAR.  DO NOT EAT OR DRINK ANY OF THE FOLLOWING:  Dairy products of any kind   Cranberry juice Tomato juice / V8 juice   Grapefruit juice Orange juice     Red grape juice  Do not eat any solid foods, including such foods as: cereal, oatmeal, yogurt, fruits, vegetables, creamed soups, eggs, bread, crackers, pureed foods in a blender, etc.   HELPFUL HINTS FOR DRINKING PREP SOLUTION:   Make sure prep is extremely cold. Mix and refrigerate the the morning of the prep. You may also put in the freezer.   You may try mixing some Crystal Light or Country Time Lemonade if you prefer. Mix in small amounts; add more if necessary.  Try drinking through a straw  Rinse mouth with water or a mouthwash between glasses, to remove after-taste.  Try sipping on a cold beverage /ice/ popsicles between glasses of  prep.  Place a piece of sugar-free hard candy in mouth between glasses.  If you become nauseated, try consuming smaller amounts, or stretch out the time between glasses. Stop for 30-60 minutes, then slowly start back drinking.     OTHER INSTRUCTIONS  You will need a responsible adult at least 57 years of age to accompany you and drive you home. This person must remain in the waiting room during your procedure. The hospital will cancel your procedure if you do not have a responsible adult with you.   1. Wear loose fitting clothing that is easily removed. 2. Leave jewelry and other valuables at home.  3. Remove all body piercing jewelry and leave at home. 4. Total time from sign-in until discharge is approximately 2-3 hours. 5. You should go home directly after your procedure and rest. You can resume normal activities the day after your procedure. 6. The day of your procedure you should not:  Drive  Make legal decisions  Operate machinery  Drink alcohol  Return to work   You may call the office (Dept: [phone]) before 5:00pm, or page the doctor on call [phone]) after 5:00pm, for further instructions, if necessary.   Insurance Information YOU WILL NEED TO CHECK WITH YOUR INSURANCE COMPANY FOR THE BENEFITS OF COVERAGE YOU HAVE FOR THIS PROCEDURE.  UNFORTUNATELY, NOT ALL INSURANCE COMPANIES HAVE BENEFITS TO COVER ALL OR PART OF THESE TYPES OF PROCEDURES.  IT IS YOUR RESPONSIBILITY TO CHECK YOUR BENEFITS, HOWEVER, WE WILL BE GLAD TO ASSIST YOU WITH ANY CODES YOUR INSURANCE COMPANY MAY NEED.    PLEASE NOTE THAT MOST INSURANCE COMPANIES WILL NOT COVER A SCREENING COLONOSCOPY FOR PEOPLE UNDER THE AGE OF 50  IF YOU HAVE BCBS INSURANCE, YOU MAY HAVE BENEFITS FOR A SCREENING COLONOSCOPY BUT IF POLYPS ARE FOUND THE DIAGNOSIS WILL CHANGE AND THEN YOU MAY HAVE A DEDUCTIBLE THAT WILL NEED TO BE MET. SO PLEASE MAKE SURE YOU CHECK YOUR BENEFITS FOR A SCREENING COLONOSCOPY AS WELL AS A  DIAGNOSTIC COLONOSCOPY.

## 2019-09-21 DIAGNOSIS — M5416 Radiculopathy, lumbar region: Secondary | ICD-10-CM | POA: Diagnosis not present

## 2019-09-21 DIAGNOSIS — M5136 Other intervertebral disc degeneration, lumbar region: Secondary | ICD-10-CM | POA: Diagnosis not present

## 2019-09-21 DIAGNOSIS — M4726 Other spondylosis with radiculopathy, lumbar region: Secondary | ICD-10-CM | POA: Diagnosis not present

## 2019-09-21 DIAGNOSIS — Z981 Arthrodesis status: Secondary | ICD-10-CM | POA: Diagnosis not present

## 2019-09-30 ENCOUNTER — Emergency Department (HOSPITAL_COMMUNITY)
Admission: EM | Admit: 2019-09-30 | Discharge: 2019-09-30 | Disposition: A | Discharge status: Home / Self Care | Payer: BC Managed Care – PPO | Attending: Emergency Medicine | Admitting: Emergency Medicine

## 2019-09-30 ENCOUNTER — Encounter (HOSPITAL_COMMUNITY): Payer: Self-pay | Admitting: *Deleted

## 2019-09-30 ENCOUNTER — Other Ambulatory Visit: Payer: Self-pay

## 2019-09-30 ENCOUNTER — Ambulatory Visit: Payer: BC Managed Care – PPO | Attending: Internal Medicine

## 2019-09-30 DIAGNOSIS — G4489 Other headache syndrome: Secondary | ICD-10-CM | POA: Diagnosis not present

## 2019-09-30 DIAGNOSIS — T7840XA Allergy, unspecified, initial encounter: Secondary | ICD-10-CM | POA: Insufficient documentation

## 2019-09-30 DIAGNOSIS — Z79899 Other long term (current) drug therapy: Secondary | ICD-10-CM | POA: Insufficient documentation

## 2019-09-30 DIAGNOSIS — R0689 Other abnormalities of breathing: Secondary | ICD-10-CM | POA: Diagnosis not present

## 2019-09-30 DIAGNOSIS — E039 Hypothyroidism, unspecified: Secondary | ICD-10-CM | POA: Diagnosis not present

## 2019-09-30 DIAGNOSIS — Z91010 Allergy to peanuts: Secondary | ICD-10-CM | POA: Diagnosis not present

## 2019-09-30 DIAGNOSIS — Z23 Encounter for immunization: Secondary | ICD-10-CM | POA: Insufficient documentation

## 2019-09-30 DIAGNOSIS — J45909 Unspecified asthma, uncomplicated: Secondary | ICD-10-CM | POA: Insufficient documentation

## 2019-09-30 DIAGNOSIS — R21 Rash and other nonspecific skin eruption: Secondary | ICD-10-CM | POA: Insufficient documentation

## 2019-09-30 DIAGNOSIS — I10 Essential (primary) hypertension: Secondary | ICD-10-CM | POA: Diagnosis not present

## 2019-09-30 DIAGNOSIS — T50Z95A Adverse effect of other vaccines and biological substances, initial encounter: Secondary | ICD-10-CM | POA: Diagnosis not present

## 2019-09-30 MED ORDER — PREDNISONE 20 MG PO TABS: 40.0000 mg | ORAL_TABLET | Freq: Every day | ORAL | 0 refills | Status: AC

## 2019-09-30 NOTE — Progress Notes (Signed)
Patient received her covid vaccination at 1950, right deltoid.  She was escorted to the 30 minute waiting zone as she has had anaphylactic reactions in the past.  At 2008 patient stated she felt hot. She had a rash developing on her neck, noted a dry mouth and thickening tongue. EMS brought in for evaluation. Blood pressure 204/102, pulse 65, oxygen 100% on 2L nasal cannula. 50 mg benadryl administered IM left deltoid 2011. 20g PIV started left antecubital, 125 mg administered. Ambulance on site. Patient able to speak clearly, verbalized the need to void her bladder.  Patient accompanied by 2 RNs to the restroom. She was stable on her feet, escorted back to EMS stretcher. She was taken to [facility]. VAERS submitted. Landis Gandy, RN

## 2019-09-30 NOTE — ED Provider Notes (Signed)
[facility] EMERGENCY DEPARTMENT Provider Note   CSN: [id number] Arrival date & time: 09/30/19  2049     History Chief Complaint  Patient presents with  . Allergic Reaction    [name] is a 59 y.o. female past medical history of kidney stones, skin cancer, hypothyroidism who presents for evaluation of possible allergic reaction that began prior to ED arrival.  Patient reports that she got her first Covid vaccine today.  Patient reports that about 10 minutes afterwards, she started feeling flushed, lightheaded, broke out into a rash to her chest, neck, face.  Patient states she has a history of allergies to peanuts but states she did not have any known exposure.  No other new exposures.  No new medications.  Patient states she did not feel like she was having any trouble breathing or vomiting but just felt very lightheaded.  She was given Benadryl and Solu-Medrol.  On ED arrival, she reports that rash has improved.  She states she feels sleepy but otherwise symptoms have resolved.  She denies any swelling of her tongue or lips, difficulty breathing, vomiting, chest pain.  The history is provided by the patient.       Past Medical History:  Diagnosis Date  . Asthma   . Atypical nevus 10/26/2012   1.RIGHT ANT THIGH -MODERATE, 2.RIGHT UPPER ABDOMEN- MILD, 3.RIGHT LABIA -MILD, 4.RIGHT UPPER BACK -MILD  . Chronic back pain   . History of cardiac catheterization 2011   Reportedly normal coronary arteries - [facility]  . History of kidney stones   . History of skin cancer   . Hypertension   . Hypothyroidism   . Hypothyroidism   . Mitral valve prolapse   . Urinary incontinence 08/12/2015    Patient Active Problem List   Diagnosis Date Noted  . Encounter for well woman exam with routine gynecological exam 09/13/2018  . 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 Well woman exam with routine gynecological exam 08/30/2016  . Burning with urination 08/30/2016  . Hot  flashes 08/30/2016  . Hematuria 08/30/2016  . Vaginal Pap smear following hysterectomy for malignancy 08/30/2016  . Screening for colorectal cancer 08/30/2016  . Mild intermittent asthma 12/15/2015  . Allergic rhinitis due to pollen 12/15/2015  . 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 Hypertension 08/12/2015  . Urinary incontinence 08/12/2015  . Special screening for malignant neoplasms, colon     Past Surgical History:  Procedure Laterality Date  . ABDOMINAL HYSTERECTOMY    . BACK SURGERY     x3-ruptured disc  . Bone spur Right    Foot  . CARDIAC CATHETERIZATION  11/2009   Normal coronary arteries ([facility])  . CHOLECYSTECTOMY    . COLONOSCOPY N/A 07/15/2014   Procedure: COLONOSCOPY;  Surgeon: Daneil Dolin, MD;  Location: [facility] SUITE;  Service: Endoscopy;  Laterality: N/A;  . CYST REMOVAL HAND Right   . KNEE ARTHROSCOPY Left      OB History    Gravida  1   Para  1   Term  1   Preterm      AB      Living  1     SAB      TAB      Ectopic      Multiple      Live Births  1           Family History  Problem Relation Age of Onset  . COPD Mother   .  Cancer Mother        skin  . Hernia Mother   . Fibromyalgia Mother   . Hypertension Mother   . Asthma Mother   . Other Father        open heart surgery  . Hypertension Father   . Hypertension Brother   . Interstitial cystitis Daughter   . Allergic rhinitis Daughter   . Asthma Daughter   . Cancer Maternal Grandmother        cervical  . Heart attack Maternal Grandmother        died at age 8  . Cancer Maternal Grandfather        colon  . Cancer Paternal Grandmother        colon, cervical,melonoma  . Other Paternal Grandmother        open heart surgery  . Arthritis Paternal Grandfather   . Leukemia Paternal Grandfather     Social History   Tobacco Use  . Smoking status: Never Smoker  . Smokeless tobacco: Never Used  Substance Use Topics  . Alcohol use: No  . Drug use: No     Home Medications Prior to Admission medications   Medication Sig Start Date End Date Taking? Authorizing Provider  acetaminophen (TYLENOL) 500 MG tablet Take 500 mg by mouth as needed.    [provider]  albuterol (PROAIR HFA) 108 (90 Base) MCG/ACT inhaler Inhale 2 puffs into the lungs every 4 (four) hours as needed for wheezing or shortness of breath. 12/15/15   Charlies Silvers, MD  celecoxib (CELEBREX) 200 MG capsule Take 200 mg by mouth daily.    [provider]  cetirizine (ZYRTEC) 10 MG tablet Take 10 mg by mouth daily.    [provider]  diazepam (VALIUM) 2 MG tablet Take 2 mg by mouth as needed for anxiety.    [provider]  diphenhydrAMINE (BENADRYL ALLERGY) 25 MG tablet Take 25 mg by mouth as needed.    [provider]  EPINEPHrine 0.3 mg/0.3 mL IJ SOAJ injection Inject 0.3 mLs (0.3 mg total) into the muscle once. 12/15/15   Charlies Silvers, MD  levothyroxine (SYNTHROID, LEVOTHROID) 75 MCG tablet Take 75 mcg by mouth daily before breakfast.    [provider]  loratadine (CLARITIN) 10 MG tablet Take 10 mg by mouth as needed for allergies. Rotates allergy medicines.    [provider]  metoprolol tartrate (LOPRESSOR) 50 MG tablet Take 50 mg by mouth 2 (two) times daily as needed (BP higher than 140/89).  03/29/18   [provider]  montelukast (SINGULAIR) 10 MG tablet Take 10 mg by mouth daily as needed (for allergies).     [provider]  olmesartan-hydrochlorothiazide (BENICAR HCT) 40-25 MG tablet Take 1 tablet by mouth daily.  06/13/18   [provider]  predniSONE (DELTASONE) 20 MG tablet Take 2 tablets (40 mg total) by mouth daily for 4 days. 09/30/19 10/04/19  Volanda Napoleon, PA-C    Allergies    Peanut-containing drug products, Dilaudid [hydromorphone hcl], Morphine and related, and Phenergan [promethazine hcl]  Review of Systems   Review of Systems  Constitutional: Positive for  diaphoresis.  Respiratory: Negative for shortness of breath.   Cardiovascular: Negative for chest pain.  Gastrointestinal: Negative for abdominal pain, nausea and vomiting.  Skin: Positive for rash.  Neurological: Positive for light-headedness. Negative for headaches.  All other systems reviewed and are negative.   Physical Exam Updated Vital Signs BP 136/79   Pulse 62  Temp (!) 96.9 F (36.1 C) (Temporal)   Resp 16   Ht 5\' 5"  (1.651 m)   Wt 63 kg   SpO2 100%   BMI 23.13 kg/m   Physical Exam Vitals and nursing note reviewed.  Constitutional:      Appearance: Normal appearance. She is well-developed.  HENT:     Head: Normocephalic and atraumatic.     Mouth/Throat:     Comments: Posterior oropharynx is clear without any signs of erythema, edema.  Uvula is midline.  Airways patent, phonation is intact.  No evidence of oral angioedema. Eyes:     General: Lids are normal.     Conjunctiva/sclera: Conjunctivae normal.     Pupils: Pupils are equal, round, and reactive to light.  Cardiovascular:     Rate and Rhythm: Normal rate and regular rhythm.     Pulses: Normal pulses.     Heart sounds: Normal heart sounds. No murmur. No friction rub. No gallop.   Pulmonary:     Effort: Pulmonary effort is normal.     Breath sounds: Normal breath sounds.     Comments: Lungs clear to auscultation bilaterally.  Symmetric chest rise.  No wheezing, rales, rhonchi. Abdominal:     Palpations: Abdomen is soft. Abdomen is not rigid.     Tenderness: There is no abdominal tenderness. There is no guarding.     Comments: Abdomen is soft, non-distended, non-tender. No rigidity, No guarding. No peritoneal signs.  Musculoskeletal:        General: Normal range of motion.     Cervical back: Full passive range of motion without pain.  Skin:    General: Skin is warm and dry.     Capillary Refill: Capillary refill takes less than 2 seconds.     Comments: No evidence of rash.  Neurological:     Mental  Status: She is alert and oriented to person, place, and time.  Psychiatric:        Speech: Speech normal.     ED Results / Procedures / Treatments   Labs (all labs ordered are listed, but only abnormal results are displayed) Labs Reviewed - No data to display  EKG None  Radiology No results found.  Procedures Procedures (including critical care time)  Medications Ordered in ED Medications - No data to display  ED Course  I have reviewed the triage vital signs and the nursing notes.  Pertinent labs & imaging results that were available during my care of the patient were reviewed by me and considered in my medical decision making (see chart for details).    MDM Rules/Calculators/A&P                      59 year old female who presents for evaluation of possible allergic reaction.  Patient reports that she started having symptoms about 10 minutes after getting her first Covid vaccine.  Reports diaphoresis, rash, lightheadedness.  Given Benadryl and Solu-Medrol.  Only known allergy is to peanuts and other medications.  No exposures that she knows of.  On initial ED arrival, she is afebrile, nontoxic-appearing.  She is slightly hypertensive.  Likely secondary to symptoms.  Vitals otherwise stable.  On exam, no evidence of oral angioedema.  Rash is improved.  Patient with no signs of respiratory distress.  No indication of angioedema that would require epinephrine here in the ED.  We will plan to observe.  Reevaluation.  Patient is hemodynamic stable.  She was able to tolerate p.o. without  any difficulty.  She reports feeling much better.  She has not had any trouble breathing, swelling of her tongue or lips.  Patient states she is ready to go home.  We will plan to put her on a short course of Benadryl and prednisone for any recurring symptoms.  I instructed patient to follow-up with her primary care doctor. At this time, patient exhibits no emergent life-threatening condition that  require further evaluation in ED or admission. Patient had ample opportunity for questions and discussion. All patient's questions were answered with full understanding. Strict return precautions discussed. Patient expresses understanding and agreement to plan.   Portions of this note were generated with [employer]. Dictation errors may occur despite best attempts at proofreading.   Final Clinical Impression(s) / ED Diagnoses Final diagnoses:  Rash    Rx / DC Orders ED Discharge Orders         Ordered    predniSONE (DELTASONE) 20 MG tablet  Daily     09/30/19 2256           Volanda Napoleon, PA-C 09/30/19 2301    Fredia Sorrow, MD 10/01/19 [phone]

## 2019-09-30 NOTE — Discharge Instructions (Signed)
As we discussed, it is possible that he had a reaction to the vaccine today.  Take Benadryl as directed.  Take prednisone as directed.  As we discussed, you will need to follow-up with your primary care doctor.    Return the emergency department for any difficulty breathing, tongue or lip swelling, vomiting, return of rash or any other worsening concerning symptoms.

## 2019-09-30 NOTE — ED Notes (Signed)
Pt drank a cup of water. Denies nausea or vomiting. States she feels much better. Headache gone. Pt states she is ready to go.

## 2019-09-30 NOTE — ED Triage Notes (Signed)
Pt received the first covid vaccine by [employer] - pt developed a rash, diaphoresis and HA.  50 mg Im benadryl given at 2010 and 125 mg IV solumedrol given at 2018, rash gone at present.  Mild HA still present per pt.

## 2019-10-04 ENCOUNTER — Ambulatory Visit: Payer: BC Managed Care – PPO

## 2019-10-06 IMAGING — CR DG LUMBAR SPINE 2-3V
2 series · 2 of 2 positions shown · non-contrast
Comparison: Lumbar MRI April 01, 2018; lumbar radiographs March 17, 2018

CLINICAL DATA: Removal of hardware L5 and S1

EXAM:
LUMBAR SPINE - 2-3 VIEW

[xtable lateral (1 of 2)]
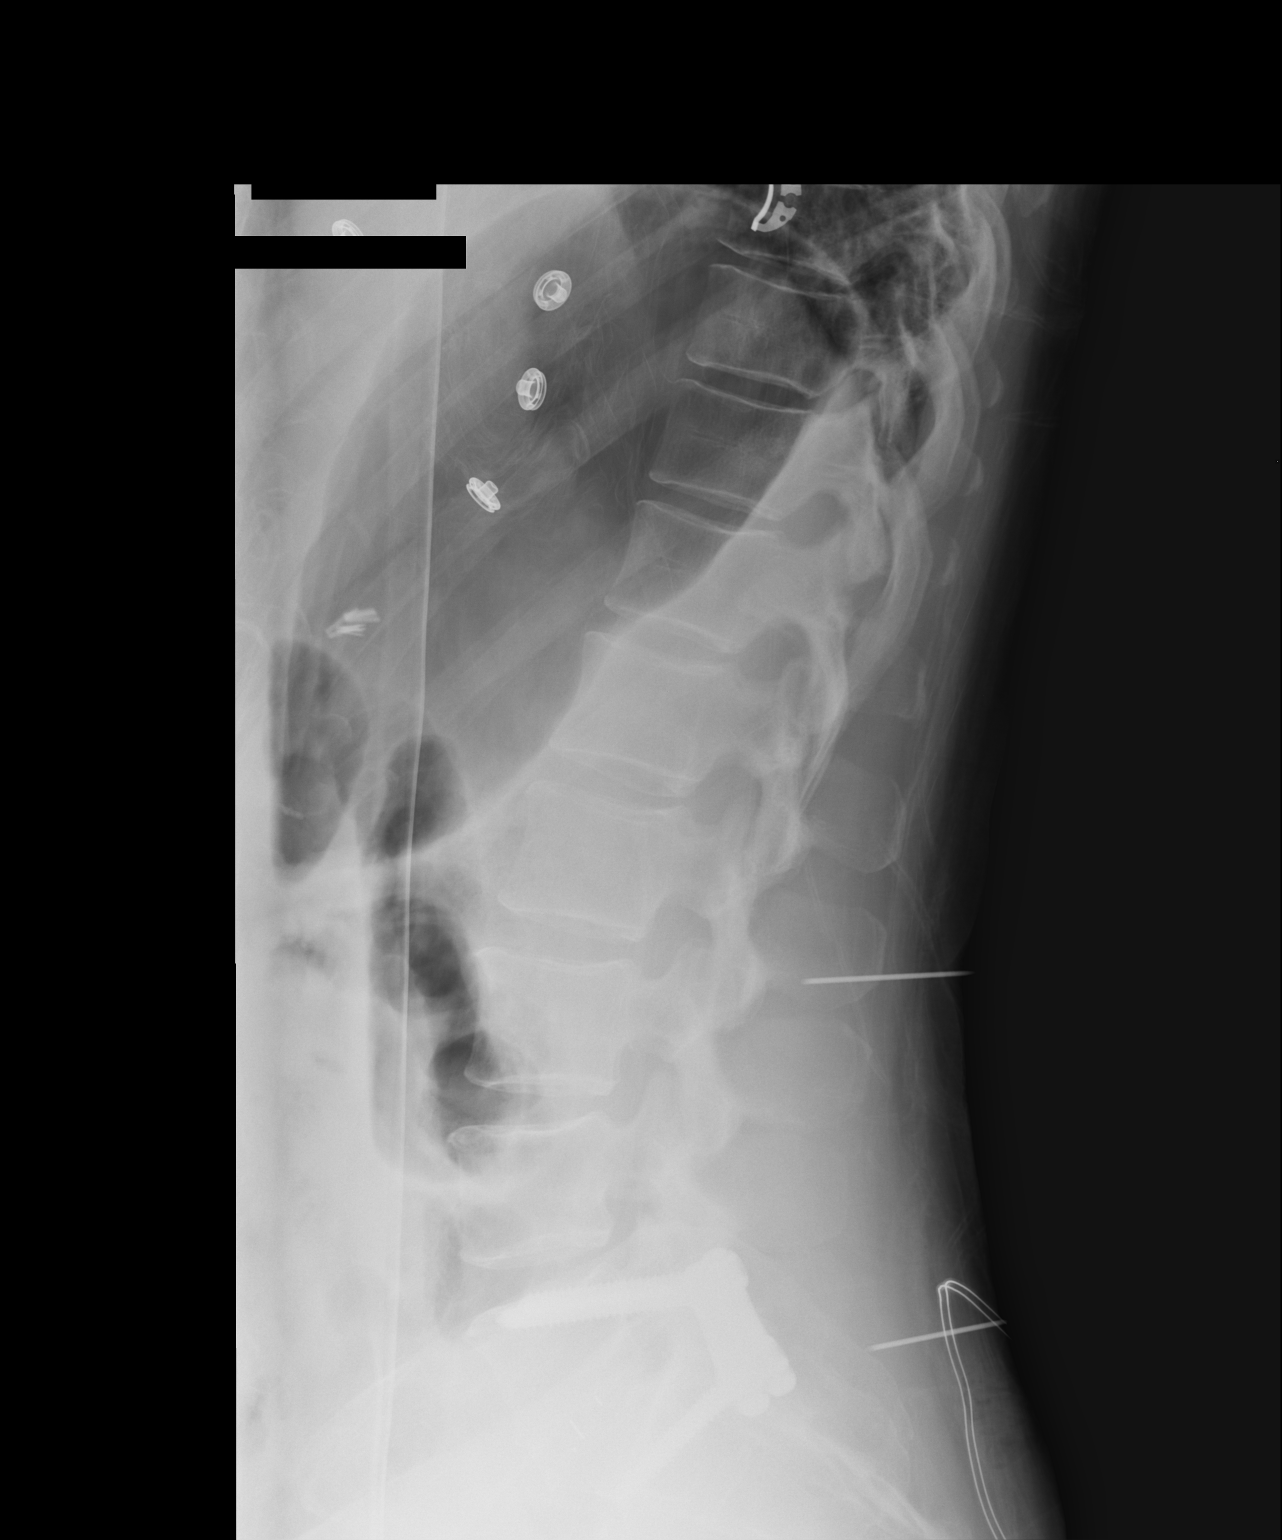

[xtable lateral (2 of 2)]
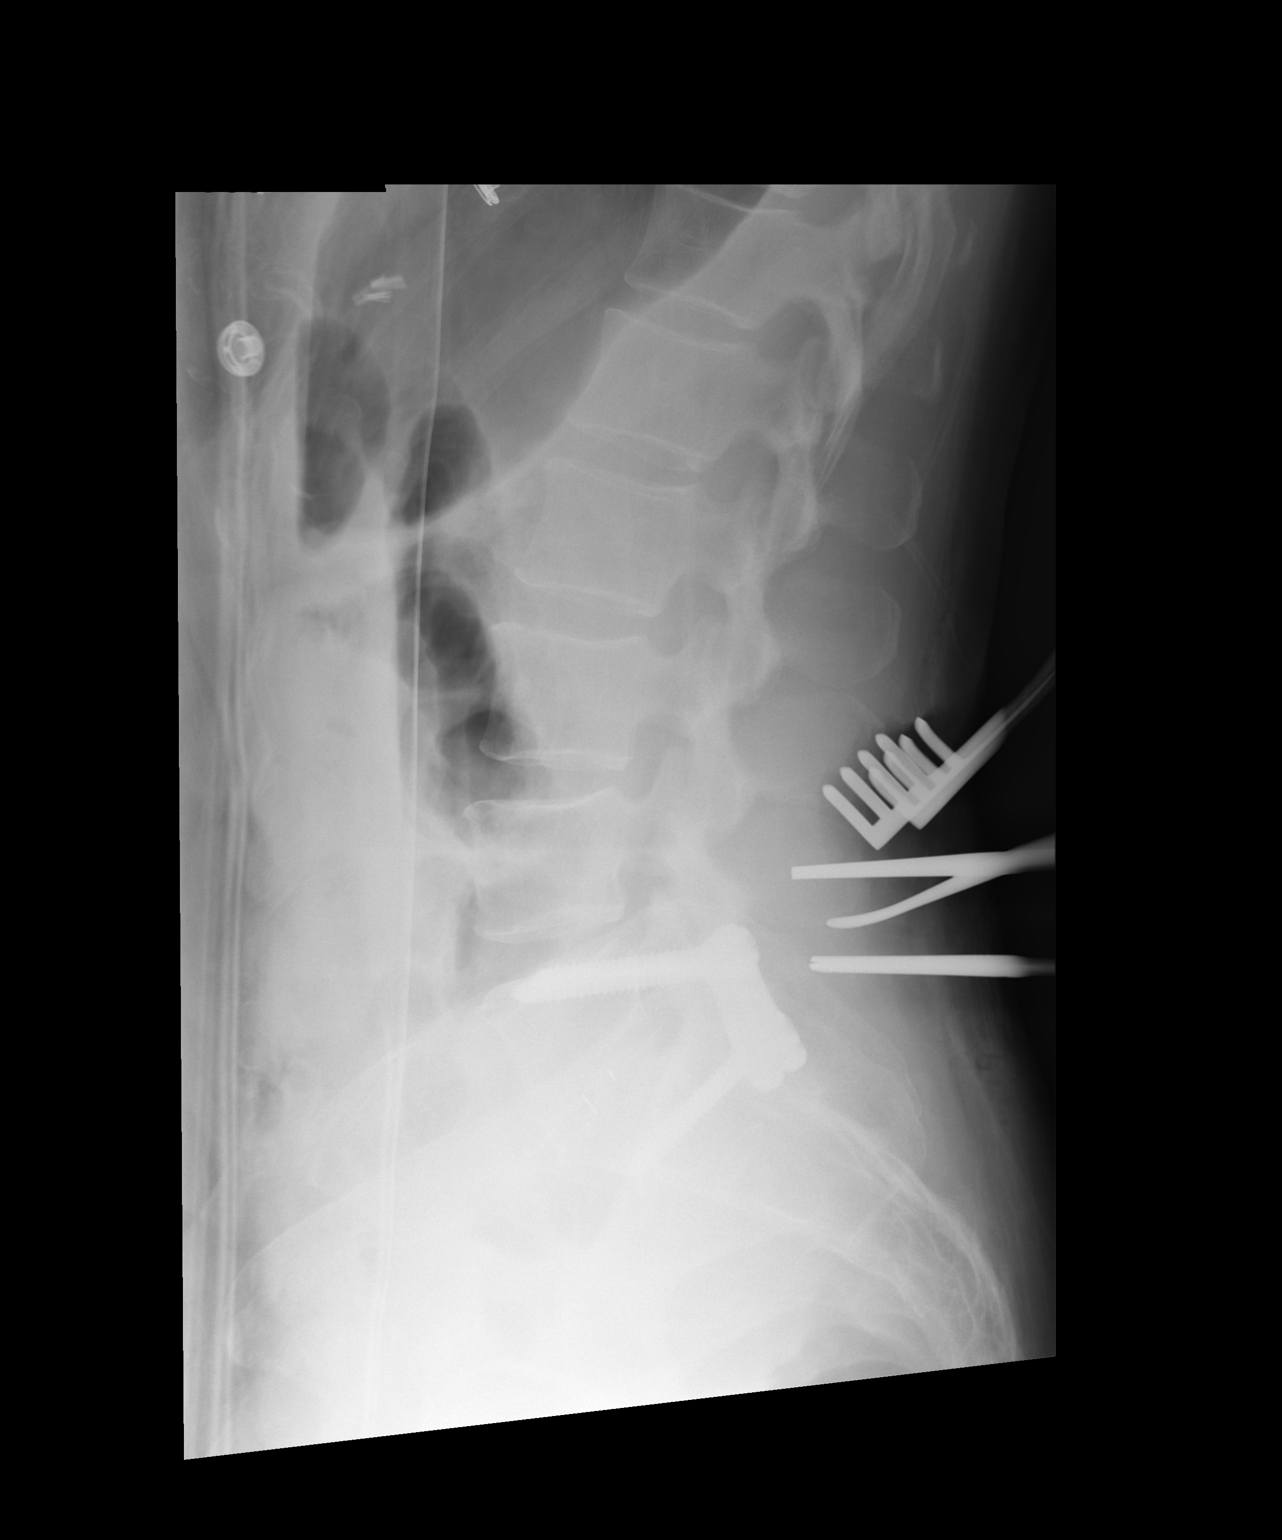

[2 of 2 positions shown; findings below may reference images not displayed]

FINDINGS: Cross-table lateral lumbar image time stamped [DATE] submitted.
Metallic probe tip is posterior to the superior aspect of the L3
vertebral body. There is posterior screw and plate fixation at L5
and S1. No fracture or spondylolisthesis.

Cross-table lateral lumbar image time stamped [DATE] submitted.
Metallic probe tips are posterior to the L4-5 interspace as well as
posterior to the mid and inferior aspects of the L5 vertebral body.
Postoperative surgical fixation noted at L5 and S1. No fracture or
spondylolisthesis.
IMPRESSION: On final submitted cross-table lateral image, the superior most
probe is posterior to the L4-5 interspace. Other probes are
posterior to the mid inferior L5 vertebral body regions.
Postoperative changes noted at L5 and S1. No fracture or
spondylolisthesis.

## 2019-10-06 IMAGING — RF DG LUMBAR SPINE 2-3V
1 series · 2 of 2 positions shown · non-contrast
Comparison: 05/15/2018

CLINICAL DATA: L4-5 PLIF

EXAM:
DG C-ARM 61-120 MIN; LUMBAR SPINE - 2-3 VIEW

[Series 1: run · 2 of 2 slices shown]
[im 1/2]
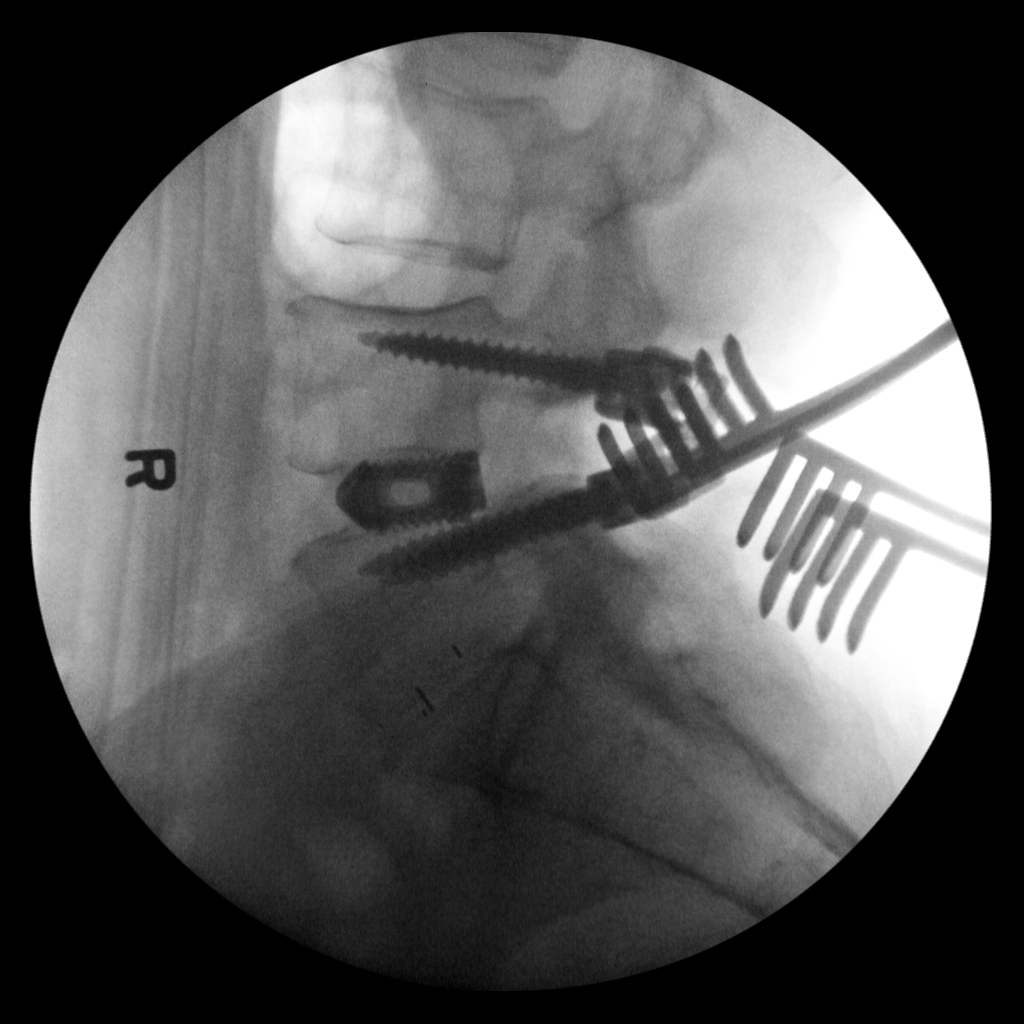
[im 2/2]
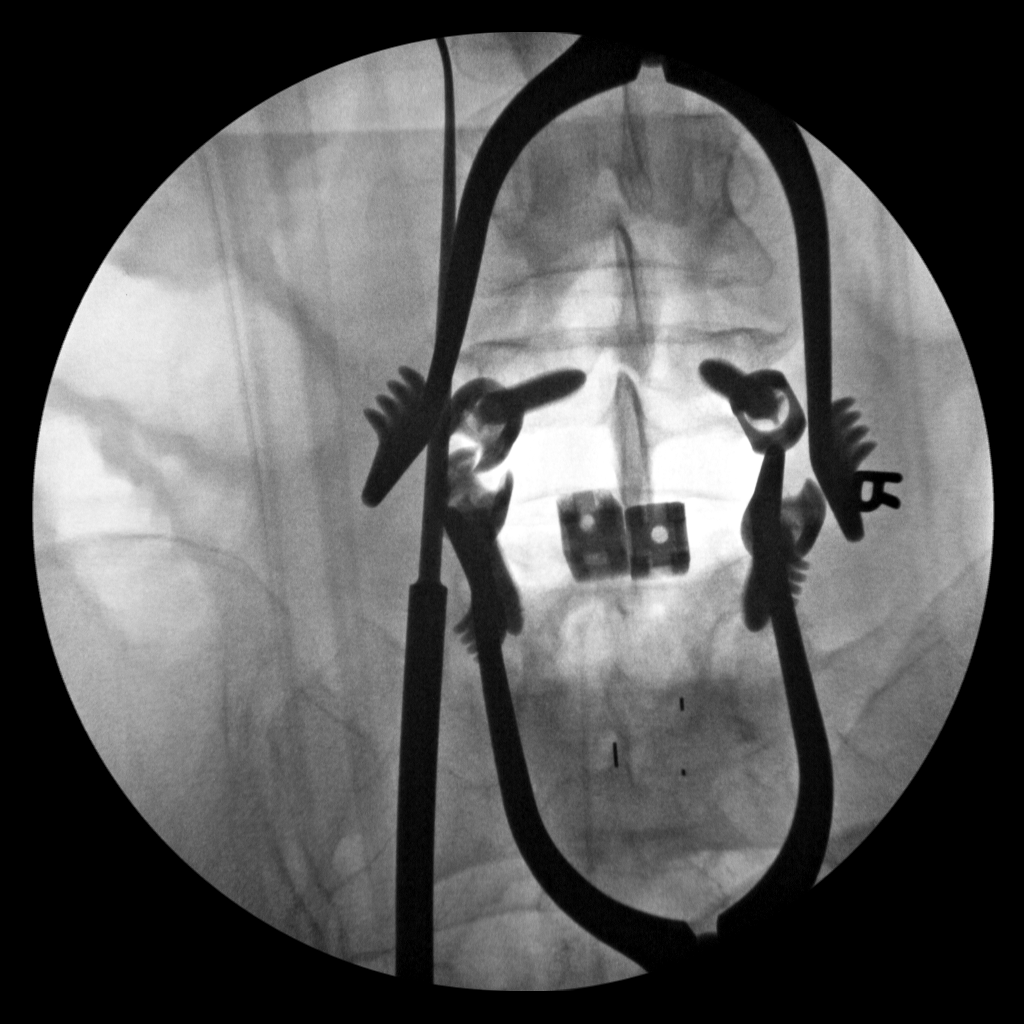

[2 of 2 positions shown; findings below may reference images not displayed]

FLUOROSCOPY TIME:  Radiation Exposure Index (as provided by the
fluoroscopic device): Not available

If the device does not provide the exposure index:

Fluoroscopy Time:  34 seconds

Number of Acquired Images:  2
FINDINGS: New interbody fusion at L4-5 is noted with pedicle screws at both
levels. Changes of prior fusion at L5-S1 are seen. Previously seen
pedicle screws at S1 have been removed.
IMPRESSION: L4-5 fusion

## 2019-10-10 DIAGNOSIS — T50Z95D Adverse effect of other vaccines and biological substances, subsequent encounter: Secondary | ICD-10-CM | POA: Diagnosis not present

## 2019-10-10 DIAGNOSIS — I10 Essential (primary) hypertension: Secondary | ICD-10-CM | POA: Diagnosis not present

## 2019-10-10 DIAGNOSIS — Z6823 Body mass index (BMI) 23.0-23.9, adult: Secondary | ICD-10-CM | POA: Diagnosis not present

## 2019-10-10 DIAGNOSIS — M5136 Other intervertebral disc degeneration, lumbar region: Secondary | ICD-10-CM | POA: Diagnosis not present

## 2019-10-16 DIAGNOSIS — Z6823 Body mass index (BMI) 23.0-23.9, adult: Secondary | ICD-10-CM | POA: Diagnosis not present

## 2019-10-16 DIAGNOSIS — T50Z95D Adverse effect of other vaccines and biological substances, subsequent encounter: Secondary | ICD-10-CM | POA: Diagnosis not present

## 2019-10-16 DIAGNOSIS — M5136 Other intervertebral disc degeneration, lumbar region: Secondary | ICD-10-CM | POA: Diagnosis not present

## 2019-10-17 ENCOUNTER — Other Ambulatory Visit (HOSPITAL_COMMUNITY): Payer: Self-pay | Admitting: Neurosurgery

## 2019-10-17 ENCOUNTER — Other Ambulatory Visit: Payer: Self-pay | Admitting: Neurosurgery

## 2019-10-17 DIAGNOSIS — M4726 Other spondylosis with radiculopathy, lumbar region: Secondary | ICD-10-CM | POA: Diagnosis not present

## 2019-10-17 DIAGNOSIS — M5136 Other intervertebral disc degeneration, lumbar region: Secondary | ICD-10-CM | POA: Diagnosis not present

## 2019-10-17 DIAGNOSIS — M5416 Radiculopathy, lumbar region: Secondary | ICD-10-CM | POA: Diagnosis not present

## 2019-10-17 DIAGNOSIS — Z981 Arthrodesis status: Secondary | ICD-10-CM | POA: Diagnosis not present

## 2019-10-21 ENCOUNTER — Ambulatory Visit: Payer: BC Managed Care – PPO

## 2019-10-25 ENCOUNTER — Ambulatory Visit (HOSPITAL_COMMUNITY)
Admission: RE | Admit: 2019-10-25 | Discharge: 2019-10-25 | Disposition: A | Discharge status: Home / Self Care | Payer: BC Managed Care – PPO | Source: Ambulatory Visit | Attending: Neurosurgery | Admitting: Neurosurgery

## 2019-10-25 ENCOUNTER — Other Ambulatory Visit: Payer: Self-pay

## 2019-10-25 DIAGNOSIS — M79605 Pain in left leg: Secondary | ICD-10-CM | POA: Diagnosis not present

## 2019-10-25 DIAGNOSIS — Z981 Arthrodesis status: Secondary | ICD-10-CM | POA: Diagnosis not present

## 2019-10-25 DIAGNOSIS — M48061 Spinal stenosis, lumbar region without neurogenic claudication: Secondary | ICD-10-CM | POA: Diagnosis not present

## 2019-11-06 DIAGNOSIS — M4726 Other spondylosis with radiculopathy, lumbar region: Secondary | ICD-10-CM | POA: Diagnosis not present

## 2019-11-06 DIAGNOSIS — M5136 Other intervertebral disc degeneration, lumbar region: Secondary | ICD-10-CM | POA: Diagnosis not present

## 2019-11-06 DIAGNOSIS — M5416 Radiculopathy, lumbar region: Secondary | ICD-10-CM | POA: Diagnosis not present

## 2019-11-06 DIAGNOSIS — M48062 Spinal stenosis, lumbar region with neurogenic claudication: Secondary | ICD-10-CM | POA: Diagnosis not present

## 2019-11-07 ENCOUNTER — Encounter: Payer: Self-pay | Admitting: Cardiology

## 2019-11-07 ENCOUNTER — Other Ambulatory Visit: Payer: Self-pay

## 2019-11-07 ENCOUNTER — Ambulatory Visit: Payer: BC Managed Care – PPO | Admitting: Cardiology

## 2019-11-07 VITALS — BP 130/70 | HR 71 | Temp 98.1°F | Ht 65.0 in | Wt 135.0 lb

## 2019-11-07 DIAGNOSIS — I10 Essential (primary) hypertension: Secondary | ICD-10-CM

## 2019-11-07 DIAGNOSIS — R002 Palpitations: Secondary | ICD-10-CM

## 2019-11-07 NOTE — Patient Instructions (Signed)
Medication Instructions:  Your physician recommends that you continue on your current medications as directed. Please refer to the Current Medication list given to you today.  *If you need a refill on your cardiac medications before your next appointment, please call your pharmacy*   Lab Work: None today If you have labs (blood work) drawn today and your tests are completely normal, you will receive your results only by: . MyChart Message (if you have MyChart) OR . A paper copy in the mail If you have any lab test that is abnormal or we need to change your treatment, we will call you to review the results.   Testing/Procedures: None today   Follow-Up: At [facility], you and your health needs are our priority.  As part of our continuing mission to provide you with exceptional heart care, we have created designated Provider Care Teams.  These Care Teams include your primary Cardiologist (physician) and Advanced Practice Providers (APPs -  Physician Assistants and Nurse Practitioners) who all work together to provide you with the care you need, when you need it.  We recommend signing up for the patient portal called "MyChart".  Sign up information is provided on this After Visit Summary.  MyChart is used to connect with patients for Virtual Visits (Telemedicine).  Patients are able to view lab/test results, encounter notes, upcoming appointments, etc.  Non-urgent messages can be sent to your provider as well.   To learn more about what you can do with MyChart, go to [email or web address].com.    Your next appointment:   12 month(s)  The format for your next appointment:   In Person  Provider:   Samuel McDowell, MD   Other Instructions None       Thank you for choosing [facility] !         

## 2019-11-07 NOTE — Progress Notes (Signed)
Cardiology Office Note  Date: 11/07/2019   ID: [name], DOB 1961-04-28, MRN [id number]  PCP:  Sharilyn Sites, MD  Cardiologist:  Rozann Lesches, MD Electrophysiologist:  None   Chief Complaint  Patient presents with  . Cardiac follow-up    History of Present Illness: [name] is a 59 y.o. female last seen in February 2020.  She presents for a routine visit.  States that she just got back from a trip to [place].  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  She does not report any progressive palpitations or syncope.  Echocardiogram and cardiac monitor from last year are outlined below.  I reviewed her medications, she is on Lopressor and Cardizem CD at this point.  Continues to follow with Dr. Hilma Favors.  I personally reviewed her ECG today which shows normal sinus rhythm.  Past Medical History:  Diagnosis Date  . Asthma   . Atypical nevus 10/26/2012   1.RIGHT ANT THIGH -MODERATE, 2.RIGHT UPPER ABDOMEN- MILD, 3.RIGHT LABIA -MILD, 4.RIGHT UPPER BACK -MILD  . Chronic back pain   . Essential hypertension   . History of cardiac catheterization 2011   Reportedly normal coronary arteries - [facility]  . History of kidney stones   . History of mitral valve prolapse   . History of skin cancer   . Hypothyroidism   . Urinary incontinence 08/12/2015    Past Surgical History:  Procedure Laterality Date  . ABDOMINAL HYSTERECTOMY    . BACK SURGERY     x3-ruptured disc  . Bone spur Right    Foot  . CARDIAC CATHETERIZATION  11/2009   Normal coronary arteries ([facility])  . CHOLECYSTECTOMY    . COLONOSCOPY N/A 07/15/2014   Procedure: COLONOSCOPY;  Surgeon: Daneil Dolin, MD;  [facility];  Service: Endoscopy;  Laterality: N/A;  . CYST REMOVAL HAND Right   . KNEE ARTHROSCOPY Left     Current Outpatient Medications  Medication Sig Dispense Refill  . acetaminophen (TYLENOL) 500 MG tablet Take 500 mg by mouth as needed.    Marland Kitchen albuterol (PROAIR  HFA) 108 (90 Base) MCG/ACT inhaler Inhale 2 puffs into the lungs every 4 (four) hours as needed for wheezing or shortness of breath. 1 Inhaler 3  . cetirizine (ZYRTEC) 10 MG tablet Take 10 mg by mouth daily.    . diazepam (VALIUM) 2 MG tablet Take 2 mg by mouth as needed for anxiety.    Marland Kitchen diltiazem (CARDIZEM CD) 180 MG 24 hr capsule Take 180 mg by mouth at bedtime.    . diphenhydrAMINE (BENADRYL ALLERGY) 25 MG tablet Take 25 mg by mouth as needed.    Marland Kitchen EPINEPHrine 0.3 mg/0.3 mL IJ SOAJ injection Inject 0.3 mLs (0.3 mg total) into the muscle once. 2 Device 2  . levothyroxine (SYNTHROID, LEVOTHROID) 75 MCG tablet Take 75 mcg by mouth daily before breakfast.    . loratadine (CLARITIN) 10 MG tablet Take 10 mg by mouth as needed for allergies. Rotates allergy medicines.    . metoprolol tartrate (LOPRESSOR) 50 MG tablet Take 50 mg by mouth 2 (two) times daily as needed (BP higher than 140/89).   2  . olmesartan-hydrochlorothiazide (BENICAR HCT) 40-25 MG tablet Take 1 tablet by mouth daily.      No current facility-administered medications for this visit.   Allergies:  Peanut-containing drug products, Dilaudid [hydromorphone hcl], Morphine and related, and Phenergan [promethazine hcl]   ROS:  No syncope.  Physical Exam: VS:  BP 130/70   Pulse 71   Temp 98.1 F (36.7 C)   Ht 5\' 5"  (1.651 m)   Wt 135 lb (61.2 kg)   SpO2 98%   BMI 22.47 kg/m , BMI Body mass index is 22.47 kg/m.  Wt Readings from Last 3 Encounters:  11/07/19 135 lb (61.2 kg)  09/30/19 139 lb (63 kg)  09/18/18 153 lb (69.4 kg)    General: Patient appears comfortable at rest. HEENT: Conjunctiva and lids normal, wearing a mask. Neck: Supple, no elevated JVP or carotid bruits, no thyromegaly. Lungs: Clear to auscultation, nonlabored breathing at rest. Cardiac: Regular rate and rhythm, no S3 or significant systolic murmur, no pericardial rub. Extremities: No pitting edema, distal pulses 2+.  ECG:  An ECG dated 05/08/2018 was  personally reviewed today and demonstrated:  Sinus rhythm with nonspecific T wave changes.  Recent Labwork:  No interval lab work for review today.  Other Studies Reviewed Today:  Echocardiogram 09/19/2018: 1. The left ventricle has hyperdynamic systolic function, with an  ejection fraction of >65%. The cavity size was normal. Left ventricular  diastolic parameters were normal. No evidence of left ventricular regional  wall motion abnormalities.  2. The right ventricle has normal systolic function. The cavity was  normal. There is no increase in right ventricular wall thickness. Right  ventricular systolic pressure. Normal estimated RVSP with an estimated  pressure of 16.4 mmHg.  3. The mitral valve is normal in structure. No diagnostic evidence of  mitral valve prolapse.  4. The tricuspid valve is normal in structure.  5. The aortic valve is tricuspid.  6. The aortic root is normal in size and structure.   Cardiac monitor March 2020: 7-day event recorder reviewed.  Sinus rhythm was present throughout.  Heart rate ranged from 44 bpm up to 136 bpm with average heart rate 73 bpm.  Rare PACs and PVCs were noted,each representing less than 1% of total beats.  Ectopic beats were isolated with the exception of a brief 5 beat burst of SVT and a few atrial couplets.  No obvious atrial fibrillation or atrial flutter.  No pauses.   Assessment and Plan:  1.  History of palpitations, no progressive symptoms on current medical therapy which includes Lopressor and Cardizem CD.  ECG today is normal.  Would continue with observation.  2.  Essential hypertension, blood pressure control is adequate today.  She continues on Lopressor, Benicar HCT and Cardizem CD.  Keep follow-up with Dr. Hilma Favors.  Medication Adjustments/Labs and Tests Ordered: Current medicines are reviewed at length with the patient today.  Concerns regarding medicines are outlined above.   Tests Ordered: Orders Placed This  Encounter  Procedures  . EKG 12-Lead    Medication Changes: No orders of the defined types were placed in this encounter.   Disposition:  Follow up 1 year in the [place] office.  Signed, Satira Sark, MD, [facility] 11/07/2019 1:38 PM    [place] [facility] [address], [place], [place] [address] Phone: [phone]; Fax: [phone]

## 2019-12-12 ENCOUNTER — Telehealth: Payer: Self-pay

## 2019-12-12 NOTE — Telephone Encounter (Signed)
Pt called office to cancel TCS scheduled for next week due to a death in the family. Can call her later to reschedule.

## 2019-12-14 ENCOUNTER — Other Ambulatory Visit (HOSPITAL_COMMUNITY)
Admission: RE | Admit: 2019-12-14 | Discharge: 2019-12-14 | Disposition: A | Discharge status: Home / Self Care | Payer: BC Managed Care – PPO | Source: Ambulatory Visit | Attending: Internal Medicine | Admitting: Internal Medicine

## 2019-12-14 NOTE — Telephone Encounter (Signed)
Spoke to pt and offered to reschedule her procedure.  She decided it would be best to call me back sometime next week when she can check her schedule.  She is busy right now spending time with her family.

## 2019-12-18 ENCOUNTER — Ambulatory Visit (HOSPITAL_COMMUNITY)
Admission: RE | Admit: 2019-12-18 | Payer: BC Managed Care – PPO | Source: Home / Self Care | Admitting: Internal Medicine

## 2019-12-18 ENCOUNTER — Encounter (HOSPITAL_COMMUNITY): Admission: RE | Payer: Self-pay | Source: Home / Self Care

## 2019-12-18 SURGERY — COLONOSCOPY
Anesthesia: Moderate Sedation

## 2020-01-11 DIAGNOSIS — Z833 Family history of diabetes mellitus: Secondary | ICD-10-CM | POA: Diagnosis not present

## 2020-01-11 DIAGNOSIS — I10 Essential (primary) hypertension: Secondary | ICD-10-CM | POA: Diagnosis not present

## 2020-01-11 DIAGNOSIS — E049 Nontoxic goiter, unspecified: Secondary | ICD-10-CM | POA: Diagnosis not present

## 2020-01-18 DIAGNOSIS — E049 Nontoxic goiter, unspecified: Secondary | ICD-10-CM | POA: Diagnosis not present

## 2020-01-18 DIAGNOSIS — E890 Postprocedural hypothyroidism: Secondary | ICD-10-CM | POA: Diagnosis not present

## 2020-01-18 DIAGNOSIS — I10 Essential (primary) hypertension: Secondary | ICD-10-CM | POA: Diagnosis not present

## 2020-01-24 DIAGNOSIS — J302 Other seasonal allergic rhinitis: Secondary | ICD-10-CM | POA: Diagnosis not present

## 2020-01-24 DIAGNOSIS — Z681 Body mass index (BMI) 19 or less, adult: Secondary | ICD-10-CM | POA: Diagnosis not present

## 2020-03-04 DIAGNOSIS — Z981 Arthrodesis status: Secondary | ICD-10-CM | POA: Diagnosis not present

## 2020-03-19 DIAGNOSIS — E049 Nontoxic goiter, unspecified: Secondary | ICD-10-CM | POA: Diagnosis not present

## 2020-04-29 DIAGNOSIS — Z6822 Body mass index (BMI) 22.0-22.9, adult: Secondary | ICD-10-CM | POA: Diagnosis not present

## 2020-04-29 DIAGNOSIS — E559 Vitamin D deficiency, unspecified: Secondary | ICD-10-CM | POA: Diagnosis not present

## 2020-04-29 DIAGNOSIS — Z0001 Encounter for general adult medical examination with abnormal findings: Secondary | ICD-10-CM | POA: Diagnosis not present

## 2020-04-29 DIAGNOSIS — I341 Nonrheumatic mitral (valve) prolapse: Secondary | ICD-10-CM | POA: Diagnosis not present

## 2020-04-29 DIAGNOSIS — M1991 Primary osteoarthritis, unspecified site: Secondary | ICD-10-CM | POA: Diagnosis not present

## 2020-04-29 DIAGNOSIS — Z1389 Encounter for screening for other disorder: Secondary | ICD-10-CM | POA: Diagnosis not present

## 2020-04-29 DIAGNOSIS — Z23 Encounter for immunization: Secondary | ICD-10-CM | POA: Diagnosis not present

## 2020-04-29 DIAGNOSIS — T50Z95D Adverse effect of other vaccines and biological substances, subsequent encounter: Secondary | ICD-10-CM | POA: Diagnosis not present

## 2020-04-29 DIAGNOSIS — R002 Palpitations: Secondary | ICD-10-CM | POA: Diagnosis not present

## 2020-04-29 DIAGNOSIS — Z1331 Encounter for screening for depression: Secondary | ICD-10-CM | POA: Diagnosis not present

## 2020-05-14 DIAGNOSIS — D225 Melanocytic nevi of trunk: Secondary | ICD-10-CM | POA: Diagnosis not present

## 2020-05-14 DIAGNOSIS — Z85828 Personal history of other malignant neoplasm of skin: Secondary | ICD-10-CM | POA: Diagnosis not present

## 2020-05-14 DIAGNOSIS — D2261 Melanocytic nevi of right upper limb, including shoulder: Secondary | ICD-10-CM | POA: Diagnosis not present

## 2020-05-14 DIAGNOSIS — D2262 Melanocytic nevi of left upper limb, including shoulder: Secondary | ICD-10-CM | POA: Diagnosis not present

## 2020-10-10 ENCOUNTER — Other Ambulatory Visit: Payer: Self-pay | Admitting: Family Medicine

## 2020-10-10 DIAGNOSIS — Z1231 Encounter for screening mammogram for malignant neoplasm of breast: Secondary | ICD-10-CM

## 2020-10-15 ENCOUNTER — Other Ambulatory Visit (HOSPITAL_COMMUNITY): Payer: Self-pay | Admitting: Family Medicine

## 2020-10-15 DIAGNOSIS — I341 Nonrheumatic mitral (valve) prolapse: Secondary | ICD-10-CM

## 2020-10-15 DIAGNOSIS — E890 Postprocedural hypothyroidism: Secondary | ICD-10-CM

## 2020-10-15 DIAGNOSIS — Z0001 Encounter for general adult medical examination with abnormal findings: Secondary | ICD-10-CM

## 2020-11-27 ENCOUNTER — Ambulatory Visit (HOSPITAL_COMMUNITY): Payer: BC Managed Care – PPO

## 2020-11-27 ENCOUNTER — Encounter (HOSPITAL_COMMUNITY): Payer: Self-pay

## 2020-12-08 ENCOUNTER — Emergency Department (HOSPITAL_COMMUNITY): Payer: 59

## 2020-12-08 ENCOUNTER — Encounter (HOSPITAL_COMMUNITY): Payer: Self-pay | Admitting: *Deleted

## 2020-12-08 ENCOUNTER — Emergency Department (HOSPITAL_COMMUNITY)
Admission: EM | Admit: 2020-12-08 | Discharge: 2020-12-08 | Disposition: A | Discharge status: Home / Self Care | Payer: 59 | Attending: Emergency Medicine | Admitting: Emergency Medicine

## 2020-12-08 ENCOUNTER — Other Ambulatory Visit: Payer: Self-pay

## 2020-12-08 DIAGNOSIS — S0512XA Contusion of eyeball and orbital tissues, left eye, initial encounter: Secondary | ICD-10-CM | POA: Diagnosis not present

## 2020-12-08 DIAGNOSIS — J45909 Unspecified asthma, uncomplicated: Secondary | ICD-10-CM | POA: Insufficient documentation

## 2020-12-08 DIAGNOSIS — I10 Essential (primary) hypertension: Secondary | ICD-10-CM | POA: Insufficient documentation

## 2020-12-08 DIAGNOSIS — Z79899 Other long term (current) drug therapy: Secondary | ICD-10-CM | POA: Insufficient documentation

## 2020-12-08 DIAGNOSIS — Z91010 Allergy to peanuts: Secondary | ICD-10-CM | POA: Insufficient documentation

## 2020-12-08 DIAGNOSIS — E039 Hypothyroidism, unspecified: Secondary | ICD-10-CM | POA: Diagnosis not present

## 2020-12-08 DIAGNOSIS — X58XXXA Exposure to other specified factors, initial encounter: Secondary | ICD-10-CM | POA: Insufficient documentation

## 2020-12-08 DIAGNOSIS — R519 Headache, unspecified: Secondary | ICD-10-CM | POA: Insufficient documentation

## 2020-12-08 DIAGNOSIS — T148XXA Other injury of unspecified body region, initial encounter: Secondary | ICD-10-CM

## 2020-12-08 DIAGNOSIS — S0592XA Unspecified injury of left eye and orbit, initial encounter: Secondary | ICD-10-CM | POA: Diagnosis present

## 2020-12-08 LAB — CBC WITH DIFFERENTIAL/PLATELET
Abs Immature Granulocytes: 0.01 10*3/uL (ref 0.00–0.07)
Basophils Absolute: 0 10*3/uL (ref 0.0–0.1)
Basophils Relative: 1 %
Eosinophils Absolute: 0 10*3/uL (ref 0.0–0.5)
Eosinophils Relative: 0 %
HCT: 43.3 % (ref 36.0–46.0)
Hemoglobin: 14.5 g/dL (ref 12.0–15.0)
Immature Granulocytes: 0 %
Lymphocytes Relative: 36 %
Lymphs Abs: 1.6 10*3/uL (ref 0.7–4.0)
MCH: 30.7 pg (ref 26.0–34.0)
MCHC: 33.5 g/dL (ref 30.0–36.0)
MCV: 91.7 fL (ref 80.0–100.0)
Monocytes Absolute: 0.3 10*3/uL (ref 0.1–1.0)
Monocytes Relative: 7 %
Neutro Abs: 2.5 10*3/uL (ref 1.7–7.7)
Neutrophils Relative %: 56 %
Platelets: 185 10*3/uL (ref 150–400)
RBC: 4.72 MIL/uL (ref 3.87–5.11)
RDW: 11.7 % (ref 11.5–15.5)
WBC: 4.4 10*3/uL (ref 4.0–10.5)
nRBC: 0 % (ref 0.0–0.2)

## 2020-12-08 LAB — BASIC METABOLIC PANEL
Anion gap: 8 (ref 5–15)
BUN: 22 mg/dL — ABNORMAL HIGH (ref 6–20)
CO2: 29 mmol/L (ref 22–32)
Calcium: 9.6 mg/dL (ref 8.9–10.3)
Chloride: 102 mmol/L (ref 98–111)
Creatinine, Ser: 0.72 mg/dL (ref 0.44–1.00)
GFR, Estimated: >60 mL/min (ref >60)
Glucose, Bld: 96 mg/dL (ref 70–99)
Potassium: 3.6 mmol/L (ref 3.5–5.1)
Sodium: 139 mmol/L (ref 135–145)

## 2020-12-08 NOTE — ED Triage Notes (Signed)
Pt is here with bruising under left eye this am and saw eye doctor.  Pt doctor told her to go to [facility] ED tonight. Pt states vision is blurry since waking up and has headache all over.

## 2020-12-08 NOTE — Discharge Instructions (Signed)
Please return for worsening blurry vision, severe headache, worsening bruising, severe pain, or other emergencies.  Otherwise please follow-up with your doctor regarding her symptoms today

## 2020-12-08 NOTE — ED Notes (Signed)
DC instructions reviewed with pt. PT verbalized understanding.  Pt Dc 

## 2020-12-08 NOTE — ED Provider Notes (Signed)
[facility] EMERGENCY DEPARTMENT Provider Note   CSN: [id number] Arrival date & time: 12/08/20  1546     History No chief complaint on file.   [name] is a 60 y.o. female.  HPI  Patient presents with bruising medial to her left eye.  She woke up with it this morning.  Her friend who is a nurse noticed that and advised her to get checked out.  She is on optometrist who said her retina was okay but was concerned about the bruising and asked her to see a doctor.  She called her primary care doctor who told her to come straight to the ER.  She does not know of any trauma or injury to the eye.  She feels that her vision might be slightly blurry compared to usual but not significantly so.  No pain in her orbit.  No other symptoms today.  No other bruising anywhere else.  She notes that occasionally since her diagnosis of A. fib she will have an occasional flutter feeling in her chest but none of that today.     Past Medical History:  Diagnosis Date  . Asthma   . Atypical nevus 10/26/2012   1.RIGHT ANT THIGH -MODERATE, 2.RIGHT UPPER ABDOMEN- MILD, 3.RIGHT LABIA -MILD, 4.RIGHT UPPER BACK -MILD  . Chronic back pain   . Essential hypertension   . History of cardiac catheterization 2011   Reportedly normal coronary arteries - [facility]  . History of kidney stones   . History of mitral valve prolapse   . History of skin cancer   . Hypothyroidism   . Urinary incontinence 08/12/2015    Patient Active Problem List   Diagnosis Date Noted  . Encounter for well woman exam with routine gynecological exam 09/13/2018  . 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 Well woman exam with routine gynecological exam 08/30/2016  . Burning with urination 08/30/2016  . Hot flashes 08/30/2016  . Hematuria 08/30/2016  . Vaginal Pap smear following hysterectomy for malignancy 08/30/2016  . Screening for colorectal cancer 08/30/2016  . Mild intermittent asthma 12/15/2015   . Allergic rhinitis due to pollen 12/15/2015  . 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 Hypertension 08/12/2015  . Urinary incontinence 08/12/2015  . Special screening for malignant neoplasms, colon     Past Surgical History:  Procedure Laterality Date  . ABDOMINAL HYSTERECTOMY    . BACK SURGERY     x3-ruptured disc  . Bone spur Right    Foot  . CARDIAC CATHETERIZATION  11/2009   Normal coronary arteries ([facility])  . CHOLECYSTECTOMY    . COLONOSCOPY N/A 07/15/2014   Procedure: COLONOSCOPY;  Surgeon: Daneil Dolin, MD;  Location[facility];  Service: Endoscopy;  Laterality: N/A;  . CYST REMOVAL HAND Right   . KNEE ARTHROSCOPY Left      OB History    Gravida  1   Para  1   Term  1   Preterm      AB      Living  1     SAB      IAB      Ectopic      Multiple      Live Births  1           Family History  Problem Relation Age of Onset  . COPD Mother   . Cancer Mother        skin  . Hernia Mother   .  Fibromyalgia Mother   . Hypertension Mother   . Asthma Mother   . Other Father        open heart surgery  . Hypertension Father   . Hypertension Brother   . Interstitial cystitis Daughter   . Allergic rhinitis Daughter   . Asthma Daughter   . Cancer Maternal Grandmother        cervical  . Heart attack Maternal Grandmother        died at age 66  . Cancer Maternal Grandfather        colon  . Cancer Paternal Grandmother        colon, cervical,melonoma  . Other Paternal Grandmother        open heart surgery  . Arthritis Paternal Grandfather   . Leukemia Paternal Grandfather     Social History   Tobacco Use  . Smoking status: Never Smoker  . Smokeless tobacco: Never Used  Vaping Use  . Vaping Use: Never used  Substance Use Topics  . Alcohol use: No  . Drug use: No    Home Medications Prior to Admission medications   Medication Sig Start Date End Date Taking? Authorizing Provider  acetaminophen (TYLENOL) 500 MG tablet Take  500 mg by mouth as needed.    [provider]  albuterol (PROAIR HFA) 108 (90 Base) MCG/ACT inhaler Inhale 2 puffs into the lungs every 4 (four) hours as needed for wheezing or shortness of breath. 12/15/15   Charlies Silvers, MD  cetirizine (ZYRTEC) 10 MG tablet Take 10 mg by mouth daily.    [provider]  diazepam (VALIUM) 2 MG tablet Take 2 mg by mouth as needed for anxiety.    [provider]  diltiazem (CARDIZEM CD) 180 MG 24 hr capsule Take 180 mg by mouth at bedtime. 10/10/19   [provider]  diphenhydrAMINE (BENADRYL ALLERGY) 25 MG tablet Take 25 mg by mouth as needed.    [provider]  EPINEPHrine 0.3 mg/0.3 mL IJ SOAJ injection Inject 0.3 mLs (0.3 mg total) into the muscle once. 12/15/15   Charlies Silvers, MD  levothyroxine (SYNTHROID, LEVOTHROID) 75 MCG tablet Take 75 mcg by mouth daily before breakfast.    [provider]  loratadine (CLARITIN) 10 MG tablet Take 10 mg by mouth as needed for allergies. Rotates allergy medicines.    [provider]  metoprolol tartrate (LOPRESSOR) 50 MG tablet Take 50 mg by mouth 2 (two) times daily as needed (BP higher than 140/89).  03/29/18   [provider]  olmesartan-hydrochlorothiazide (BENICAR HCT) 40-25 MG tablet Take 1 tablet by mouth daily.  06/13/18   [provider]    Allergies    Peanut-containing drug products, Dilaudid [hydromorphone hcl], Morphine and related, and Phenergan [promethazine hcl]  Review of Systems   Review of Systems  Constitutional: Negative for chills and fever.  HENT: Negative for ear pain and sore throat.   Eyes: Negative for pain and redness.  Respiratory: Negative for cough and shortness of breath.   Cardiovascular: Negative for chest pain and palpitations.  Gastrointestinal: Negative for abdominal pain and vomiting.  Genitourinary: Negative for dysuria and hematuria.  Musculoskeletal: Negative for arthralgias and back pain.   Skin: Negative for color change and rash.  Neurological: Negative for seizures and syncope.  All other systems reviewed and are negative.   Physical Exam Updated Vital Signs BP (!) 156/122   Pulse 67   Temp 98 F (36.7 C) (Oral)   Resp 10  SpO2 100%   Physical Exam Vitals and nursing note reviewed.  Constitutional:      General: She is not in acute distress.    Appearance: She is well-developed.  HENT:     Head: Normocephalic and atraumatic.  Eyes:     Extraocular Movements: Extraocular movements intact.     Conjunctiva/sclera: Conjunctivae normal.     Comments: Small area of left-sided ecchymosis medial to the globe.  No globe involvement.  Extraocular movements full without pain.  Sclera and conjunctive a clear.  No iritis findings.  Nontender to palpation of the orbit.  Visual acuity 20/25 on the left, 20/20 on the right.  Cardiovascular:     Rate and Rhythm: Normal rate and regular rhythm.     Heart sounds: No murmur heard.   Pulmonary:     Effort: Pulmonary effort is normal. No respiratory distress.     Breath sounds: Normal breath sounds.  Abdominal:     Palpations: Abdomen is soft.     Tenderness: There is no abdominal tenderness.  Musculoskeletal:        General: No swelling or deformity.     Cervical back: Neck supple.  Skin:    General: Skin is warm and dry.  Neurological:     General: No focal deficit present.     Mental Status: She is alert.     Cranial Nerves: No cranial nerve deficit.     Sensory: No sensory deficit.     Motor: No weakness.     Coordination: Coordination normal.  Psychiatric:        Behavior: Behavior normal.        Judgment: Judgment normal.     ED Results / Procedures / Treatments   Labs (all labs ordered are listed, but only abnormal results are displayed) Labs Reviewed  BASIC METABOLIC PANEL - Abnormal; Notable for the following components:      Result Value   BUN 22 (*)    All other components within normal limits   CBC WITH DIFFERENTIAL/PLATELET    EKG None  Radiology DG Chest 2 View  Result Date: 12/08/2020 CLINICAL DATA:  Chest pain.  Blurry vision and headache. EXAM: CHEST - 2 VIEW COMPARISON:  05/03/2015 FINDINGS: The heart size and mediastinal contours are within normal limits. Both lungs are clear. The visualized skeletal structures are unremarkable. IMPRESSION: No active cardiopulmonary disease. Electronically Signed   By: Kerby Moors M.D.   On: 12/08/2020 19:33   CT Head Wo Contrast  Result Date: 12/08/2020 CLINICAL DATA:  Headache. EXAM: CT HEAD WITHOUT CONTRAST TECHNIQUE: Contiguous axial images were obtained from the base of the skull through the vertex without intravenous contrast. COMPARISON:  CT head August 27, 2016. FINDINGS: Brain: No evidence of acute large vascular territory infarction, hemorrhage, hydrocephalus, extra-axial collection or mass lesion/mass effect. Vascular: No hyperdense vessel. Skull: No acute fracture. Sinuses/Orbits: Clear sinuses.  Unremarkable orbits. Other: No mastoid effusions. IMPRESSION: No evidence of acute intracranial abnormality. Electronically Signed   By: Margaretha Sheffield MD   On: 12/08/2020 19:47    Procedures Procedures   Medications Ordered in ED Medications - No data to display  ED Course  I have reviewed the triage vital signs and the nursing notes.  Pertinent labs & imaging results that were available during my care of the patient were reviewed by me and considered in my medical decision making (see chart for details).    MDM Rules/Calculators/A&P  Patient presents with eye problem.  No signs of globe involvement.  CT head without any retrobulbar hemorrhage.  Clinically benign findings without evidence of acute threat to the eye.  Visual acuity is not concerning on exam.  No evidence of infection.  No bruises on the rest of the body to concern for hematologic process.  Appropriate for outpatient follow-up.   Patient understands and was discharged  Final Clinical Impression(s) / ED Diagnoses Final diagnoses:  Bruising    Rx / DC Orders ED Discharge Orders    None       Aris Lot, MD 12/08/20 2040    Elnora Morrison, MD 12/10/20 [phone]

## 2020-12-08 NOTE — ED Provider Notes (Signed)
Emergency Medicine Provider Triage Evaluation Note  [name] , a 60 y.o. female  was evaluated in triage.  Pt complains of waking up with a black eye and headache of sudden onset. Sent in by PCP after she also noted to have shortness of pain to her chest history of A. fib started COVID-19 vaccine.  Review of Systems  Positive: Headache, blurred vision Negative: Nausea, vomiting  Physical Exam  BP (!) 182/87 (BP Location: Left Arm)   Pulse 80   Temp 98.6 F (37 C) (Oral)   Resp 17   SpO2 100%  Gen:   Awake, no distress   Resp:  Normal effort  MSK:   Moves extremities without difficulty  Other:  Bruising noted to the left eye, pupils are equal and reactive. No focal deficit.   Medical Decision Making  Medically screening exam initiated at 6:11 PM.  Appropriate orders placed.  [name] was informed that the remainder of the evaluation will be completed by another provider, this initial triage assessment does not replace that evaluation, and the importance of remaining in the ED until their evaluation is complete.   Here today waking up with Pain, noted to to have palpitations to her left chest. Family history of aneurysm.  Prior history of Brain CA. CT HEAD and labs ordered.    Janeece Fitting, PA-C 12/08/20 1815    Valarie Merino, MD 12/09/20 [phone]

## 2021-02-01 ENCOUNTER — Ambulatory Visit
Admission: EM | Admit: 2021-02-01 | Discharge: 2021-02-01 | Disposition: A | Discharge status: Home / Self Care | Payer: 59 | Attending: Urgent Care | Admitting: Urgent Care

## 2021-02-01 ENCOUNTER — Other Ambulatory Visit: Payer: Self-pay

## 2021-02-01 ENCOUNTER — Encounter: Payer: Self-pay | Admitting: Emergency Medicine

## 2021-02-01 DIAGNOSIS — J4530 Mild persistent asthma, uncomplicated: Secondary | ICD-10-CM

## 2021-02-01 DIAGNOSIS — U071 COVID-19: Secondary | ICD-10-CM | POA: Diagnosis not present

## 2021-02-01 DIAGNOSIS — Z20822 Contact with and (suspected) exposure to covid-19: Secondary | ICD-10-CM

## 2021-02-01 MED ORDER — MOLNUPIRAVIR EUA 200MG CAPSULE: 4.0000 | ORAL_CAPSULE | Freq: Two times a day (BID) | ORAL | 0 refills | Status: AC

## 2021-02-01 MED ORDER — PROMETHAZINE-DM 6.25-15 MG/5ML PO SYRP: 5.0000 mL | ORAL_SOLUTION | Freq: Every evening | ORAL | 0 refills | Status: DC | PRN

## 2021-02-01 MED ORDER — BENZONATATE 100 MG PO CAPS: 100.0000 mg | ORAL_CAPSULE | Freq: Three times a day (TID) | ORAL | 0 refills | Status: DC | PRN

## 2021-02-01 MED ORDER — PREDNISONE 20 MG PO TABS: ORAL_TABLET | ORAL | 0 refills | Status: DC

## 2021-02-01 NOTE — ED Provider Notes (Signed)
[name]   MRN: [id number] DOB: Jul 12, 1961  Subjective:   [name] is a 60 y.o. female with pmh of asthma presenting for 1 day history of acute onset scratchy throat, headache, cough, bilateral eye discomfort. Had an at home COVID test that was negative. Does not have full COVID vaccination.  She did have very close exposure to COVID-19 through her daughter.  She is using her albuterol inhaler with good relief.  No current facility-administered medications for this encounter.  Current Outpatient Medications:    acetaminophen (TYLENOL) 500 MG tablet, Take 500 mg by mouth as needed., Disp: , Rfl:    albuterol (PROAIR HFA) 108 (90 Base) MCG/ACT inhaler, Inhale 2 puffs into the lungs every 4 (four) hours as needed for wheezing or shortness of breath., Disp: 1 Inhaler, Rfl: 3   cetirizine (ZYRTEC) 10 MG tablet, Take 10 mg by mouth daily., Disp: , Rfl:    diazepam (VALIUM) 2 MG tablet, Take 2 mg by mouth as needed for anxiety., Disp: , Rfl:    diltiazem (CARDIZEM CD) 180 MG 24 hr capsule, Take 180 mg by mouth at bedtime., Disp: , Rfl:    diphenhydrAMINE (BENADRYL ALLERGY) 25 MG tablet, Take 25 mg by mouth as needed., Disp: , Rfl:    EPINEPHrine 0.3 mg/0.3 mL IJ SOAJ injection, Inject 0.3 mLs (0.3 mg total) into the muscle once., Disp: 2 Device, Rfl: 2   levothyroxine (SYNTHROID, LEVOTHROID) 75 MCG tablet, Take 75 mcg by mouth daily before breakfast., Disp: , Rfl:    loratadine (CLARITIN) 10 MG tablet, Take 10 mg by mouth as needed for allergies. Rotates allergy medicines., Disp: , Rfl:    metoprolol tartrate (LOPRESSOR) 50 MG tablet, Take 50 mg by mouth 2 (two) times daily as needed (BP higher than 140/89). , Disp: , Rfl: 2   olmesartan-hydrochlorothiazide (BENICAR HCT) 40-25 MG tablet, Take 1 tablet by mouth daily. , Disp: , Rfl:    Allergies  Allergen Reactions   Peanut-Containing Drug Products Hives and Shortness Of Breath   Dilaudid [Hydromorphone Hcl] Nausea And  Vomiting        Morphine And Related Nausea And Vomiting   Phenergan [Promethazine Hcl] Nausea And Vomiting    Past Medical History:  Diagnosis Date   Asthma    Atypical nevus 10/26/2012   1.RIGHT ANT THIGH -MODERATE, 2.RIGHT UPPER ABDOMEN- MILD, 3.RIGHT LABIA -MILD, 4.RIGHT UPPER BACK -MILD   Chronic back pain    Essential hypertension    History of cardiac catheterization 2011   Reportedly normal coronary arteries - [facility]   History of kidney stones    History of mitral valve prolapse    History of skin cancer    Hypothyroidism    Urinary incontinence 08/12/2015     Past Surgical History:  Procedure Laterality Date   ABDOMINAL HYSTERECTOMY     BACK SURGERY     x3-ruptured disc   Bone spur Right    Foot   CARDIAC CATHETERIZATION  11/2009   Normal coronary arteries ([place])   CHOLECYSTECTOMY     COLONOSCOPY N/A 07/15/2014   Procedure: COLONOSCOPY;  Surgeon: Daneil Dolin, MD;  Location: [facility];  Service: Endoscopy;  Laterality: N/A;   CYST REMOVAL HAND Right    KNEE ARTHROSCOPY Left     Family History  Problem Relation Age of Onset   COPD Mother    Cancer Mother        skin   Hernia Mother    Fibromyalgia Mother  Hypertension Mother    Asthma Mother    Other Father        open heart surgery   Hypertension Father    Hypertension Brother    Interstitial cystitis Daughter    Allergic rhinitis Daughter    Asthma Daughter    Cancer Maternal Grandmother        cervical   Heart attack Maternal Grandmother        died at age 91   Cancer Maternal Grandfather        colon   Cancer Paternal Grandmother        colon, cervical,melonoma   Other Paternal Grandmother        open heart surgery   Arthritis Paternal Grandfather    Leukemia Paternal Grandfather     Social History   Tobacco Use   Smoking status: Never   Smokeless tobacco: Never  Vaping Use   Vaping Use: Never used  Substance Use Topics   Alcohol use: No   Drug use: No     ROS   Objective:   Vitals: BP (!) 160/78 (BP Location: Right Arm)   Pulse 79   Temp 98.4 F (36.9 C) (Oral)   Resp 16   SpO2 98%   Physical Exam Constitutional:      General: She is not in acute distress.    Appearance: Normal appearance. She is well-developed. She is not ill-appearing, toxic-appearing or diaphoretic.  HENT:     Head: Normocephalic and atraumatic.     Nose: Nose normal.     Mouth/Throat:     Mouth: Mucous membranes are moist.  Eyes:     Extraocular Movements: Extraocular movements intact.     Pupils: Pupils are equal, round, and reactive to light.  Cardiovascular:     Rate and Rhythm: Normal rate and regular rhythm.     Pulses: Normal pulses.     Heart sounds: Normal heart sounds. No murmur heard.   No friction rub. No gallop.  Pulmonary:     Effort: Pulmonary effort is normal. No respiratory distress.     Breath sounds: Normal breath sounds. No stridor. No wheezing, rhonchi or rales.  Skin:    General: Skin is warm and dry.     Findings: No rash.  Neurological:     Mental Status: She is alert and oriented to person, place, and time.  Psychiatric:        Mood and Affect: Mood normal.        Behavior: Behavior normal.        Thought Content: Thought content normal.    Assessment and Plan :   PDMP not reviewed this encounter.  1. Clinical diagnosis of COVID-19   2. Exposure to COVID-19 virus   3. Mild persistent asthma without complication     Given her close exposure, she has a clinical diagnosis of COVID-19.  Recommended filling molnupiravir should her COVID test resulted positive.  Otherwise we will manage with supportive care.  In light of her asthma, use of prednisone course. Counseled patient on potential for adverse effects with medications prescribed/recommended today, ER and return-to-clinic precautions discussed, patient verbalized understanding.    Jaynee Eagles, PA-C 02/01/21 1254

## 2021-02-01 NOTE — ED Triage Notes (Signed)
Scratchy throat, headache, cough and eyes hurting since yesterday.  Daughter tested positive for covid yesterday.

## 2021-02-01 NOTE — Discharge Instructions (Addendum)
We will notify you of your COVID-19 test results as they arrive and may take between 24 to 48 hours.  I encourage you to sign up for MyChart if you have not already done so as this can be the easiest way for [place] to communicate results to you online or through a phone app.  In the meantime, if you develop worsening symptoms including fever, chest pain, shortness of breath despite our current treatment plan then please report to the emergency room as this may be a sign of worsening status from possible COVID-19 infection.   Hold off on starting molnupiravir, the antiviral COVID medication, until we can confirm a positive test result.   We will be using prednisone because of your asthma. Otherwise, we will manage this as a viral syndrome. For sore throat or cough try using a honey-based tea. Use 3 teaspoons of honey with juice squeezed from half lemon. Place shaved pieces of ginger into 1/2-1 cup of water and warm over stove top. Then mix the ingredients and repeat every 4 hours as needed. Please take Tylenol 500mg -650mg  every 6 hours for aches and pains, fevers. Hydrate very well with at least 2 liters of water. Eat light meals such as soups to replenish electrolytes and soft fruits, veggies. Start an antihistamine like Zyrtec, Allegra or Claritin for postnasal drainage, sinus congestion.

## 2021-02-02 LAB — NOVEL CORONAVIRUS, NAA: SARS-CoV-2, NAA: DETECTED — AB

## 2021-02-02 LAB — SARS-COV-2, NAA 2 DAY TAT

## 2021-06-11 ENCOUNTER — Encounter: Payer: Self-pay | Admitting: *Deleted

## 2021-06-11 ENCOUNTER — Ambulatory Visit (INDEPENDENT_AMBULATORY_CARE_PROVIDER_SITE_OTHER): Payer: Self-pay | Admitting: *Deleted

## 2021-06-11 ENCOUNTER — Other Ambulatory Visit: Payer: Self-pay

## 2021-06-11 VITALS — Ht 65.0 in | Wt 148.2 lb

## 2021-06-11 DIAGNOSIS — Z1211 Encounter for screening for malignant neoplasm of colon: Secondary | ICD-10-CM

## 2021-06-11 MED ORDER — NA SULFATE-K SULFATE-MG SULF 17.5-3.13-1.6 GM/177ML PO SOLN: 1.0000 | Freq: Once | ORAL | 0 refills | Status: AC

## 2021-06-11 NOTE — Progress Notes (Addendum)
Gastroenterology Pre-Procedure Review  Request Date: 06/11/2021 Requesting Physician: 5 year repeat (overdue), Last triage 09/18/2019, Last TCS 07/15/2014 done by Dr. Gala Romney, anal canal hemorrhoids and anal papilla, normal colon, family hx of colon cancer (grandfather, grandmother, 2 cousins)  PATIENT REVIEW QUESTIONS: The patient responded to the following health questions as indicated:    1. Diabetes Melitis: no 2. Joint replacements in the past 12 months: no 3. Major health problems in the past 3 months: no 4. Has an artificial valve or MVP: yes, MVP-pt says she was diagnosed about 3 years ago 60. Has a defibrillator: no 6. Has been advised in past to take antibiotics in advance of a procedure like teeth cleaning: no 7. Family history of colon cancer: yes, grandfather: age 48's, grandmother: age 61's, 2 cousins: age 2's, age 64's  8. Alcohol Use: no 9. Illicit drug Use: no 10. History of sleep apnea: no  11. History of coronary artery or other vascular stents placed within the last 12 months: no 12. History of any prior anesthesia complications: yes, hard to wake, n/v 13. Body mass index is 24.66 kg/m.    MEDICATIONS & ALLERGIES:    Patient reports the following regarding taking any blood thinners:   Plavix? no Aspirin? no Coumadin? no Brilinta? no Xarelto? no Eliquis? no Pradaxa? no Savaysa? no Effient? no  Patient confirms/reports the following medications:  Current Outpatient Medications  Medication Sig Dispense Refill   acetaminophen (TYLENOL) 500 MG tablet Take 500 mg by mouth as needed.     albuterol (PROAIR HFA) 108 (90 Base) MCG/ACT inhaler Inhale 2 puffs into the lungs every 4 (four) hours as needed for wheezing or shortness of breath. (Patient taking differently: Inhale 2 puffs into the lungs as needed for wheezing or shortness of breath.) 1 Inhaler 3   celecoxib (CELEBREX) 200 MG capsule Take 200 mg by mouth as needed.     cetirizine (ZYRTEC) 10 MG tablet Take 10  mg by mouth daily.     cyclobenzaprine (FLEXERIL) 10 MG tablet Take 10 mg by mouth as needed.     diazepam (VALIUM) 2 MG tablet Take 2 mg by mouth as needed for anxiety.     diltiazem (CARDIZEM CD) 180 MG 24 hr capsule Take 180 mg by mouth at bedtime.     diphenhydrAMINE (BENADRYL) 25 MG tablet Take 25 mg by mouth as needed.     EPINEPHrine 0.3 mg/0.3 mL IJ SOAJ injection Inject 0.3 mLs (0.3 mg total) into the muscle once. 2 Device 2   levothyroxine (SYNTHROID, LEVOTHROID) 75 MCG tablet Take 100 mcg by mouth daily before breakfast.     metoprolol tartrate (LOPRESSOR) 50 MG tablet Take 50 mg by mouth as needed (BP higher than 140/89). Only takes if bp elevated.  2   olmesartan (BENICAR) 20 MG tablet Take 20 mg by mouth daily.     rosuvastatin (CRESTOR) 5 MG tablet at bedtime.     scopolamine (TRANSDERM-SCOP) 1 MG/3DAYS as needed.     No current facility-administered medications for this visit.    Patient confirms/reports the following allergies:  Allergies  Allergen Reactions   Peanut-Containing Drug Products Hives and Shortness Of Breath   Dilaudid [Hydromorphone Hcl] Nausea And Vomiting        Morphine And Related Nausea And Vomiting   Phenergan [Promethazine Hcl] Nausea And Vomiting    No orders of the defined types were placed in this encounter.   AUTHORIZATION INFORMATION Primary Insurance: [employer],  ID #: [id number],  Group #:  [id number] Pre-Cert / Josem Kaufmann required: No, not required per [employer] Pre-Cert/ Auth #: REF#: [id number]  SCHEDULE INFORMATION: Procedure has been scheduled as follows:  Date: 07/01/2021, Time: 2:00 Location: [facility] with Dr. Gala Romney  This Gastroenterology Pre-Precedure Review Form is being routed to the following provider(s): Roseanne Kaufman, NP

## 2021-06-15 NOTE — Progress Notes (Signed)
[name], how often does she take Valium?

## 2021-06-15 NOTE — Progress Notes (Signed)
She only takes Valium when she flies in airplane.

## 2021-06-22 NOTE — Progress Notes (Signed)
Appropriate. ASA 2.  

## 2021-06-23 ENCOUNTER — Telehealth: Payer: Self-pay | Admitting: Internal Medicine

## 2021-06-23 NOTE — Telephone Encounter (Signed)
Patient needs to reschedule her procedure please call

## 2021-06-24 NOTE — Telephone Encounter (Signed)
Spoke to pt.  She said that her daughter sprung her ankle so she needed to reschedule her procedure.  Informed her that I would call her once Jan procedure schedule has been released.  She voiced understanding.  Called and made [place] in Endo aware.

## 2021-07-01 ENCOUNTER — Encounter (HOSPITAL_COMMUNITY): Admission: RE | Payer: Self-pay | Source: Ambulatory Visit

## 2021-07-01 ENCOUNTER — Ambulatory Visit (HOSPITAL_COMMUNITY): Admission: RE | Admit: 2021-07-01 | Payer: 59 | Source: Ambulatory Visit | Admitting: Internal Medicine

## 2021-07-01 SURGERY — COLONOSCOPY
Anesthesia: Moderate Sedation

## 2021-07-09 NOTE — Telephone Encounter (Signed)
Spoke to pt.  Offered to reschedule procedure.  She informed me that she would have to call me back.  She wants to check her schedule first.

## 2021-07-13 NOTE — Telephone Encounter (Signed)
Lmom for pt to call me back. 

## 2021-07-16 NOTE — Telephone Encounter (Signed)
Spoke to pt.  She informed me that she is sick with fever right now.  She just got home from the doctor's office.  She is going to call me back when she feels better.

## 2021-07-30 ENCOUNTER — Encounter (HOSPITAL_COMMUNITY): Payer: Self-pay | Admitting: Emergency Medicine

## 2021-07-30 ENCOUNTER — Emergency Department (HOSPITAL_COMMUNITY)
Admission: EM | Admit: 2021-07-30 | Discharge: 2021-07-30 | Disposition: A | Discharge status: Home / Self Care | Payer: 59 | Attending: Emergency Medicine | Admitting: Emergency Medicine

## 2021-07-30 ENCOUNTER — Other Ambulatory Visit: Payer: Self-pay

## 2021-07-30 ENCOUNTER — Emergency Department (HOSPITAL_COMMUNITY): Payer: 59

## 2021-07-30 DIAGNOSIS — J4520 Mild intermittent asthma, uncomplicated: Secondary | ICD-10-CM | POA: Diagnosis not present

## 2021-07-30 DIAGNOSIS — J45909 Unspecified asthma, uncomplicated: Secondary | ICD-10-CM | POA: Diagnosis not present

## 2021-07-30 DIAGNOSIS — Z91010 Allergy to peanuts: Secondary | ICD-10-CM | POA: Insufficient documentation

## 2021-07-30 DIAGNOSIS — E039 Hypothyroidism, unspecified: Secondary | ICD-10-CM | POA: Diagnosis not present

## 2021-07-30 DIAGNOSIS — J189 Pneumonia, unspecified organism: Secondary | ICD-10-CM | POA: Diagnosis not present

## 2021-07-30 DIAGNOSIS — R059 Cough, unspecified: Secondary | ICD-10-CM | POA: Insufficient documentation

## 2021-07-30 DIAGNOSIS — Z85820 Personal history of malignant melanoma of skin: Secondary | ICD-10-CM | POA: Insufficient documentation

## 2021-07-30 DIAGNOSIS — I10 Essential (primary) hypertension: Secondary | ICD-10-CM | POA: Insufficient documentation

## 2021-07-30 DIAGNOSIS — Z20822 Contact with and (suspected) exposure to covid-19: Secondary | ICD-10-CM | POA: Diagnosis not present

## 2021-07-30 DIAGNOSIS — Z79899 Other long term (current) drug therapy: Secondary | ICD-10-CM | POA: Insufficient documentation

## 2021-07-30 DIAGNOSIS — R0602 Shortness of breath: Secondary | ICD-10-CM | POA: Diagnosis not present

## 2021-07-30 DIAGNOSIS — J40 Bronchitis, not specified as acute or chronic: Secondary | ICD-10-CM

## 2021-07-30 LAB — CBC WITH DIFFERENTIAL/PLATELET
Abs Immature Granulocytes: 0.04 10*3/uL (ref 0.00–0.07)
Basophils Absolute: 0 10*3/uL (ref 0.0–0.1)
Basophils Relative: 1 %
Eosinophils Absolute: 0 10*3/uL (ref 0.0–0.5)
Eosinophils Relative: 0 %
HCT: 44.6 % (ref 36.0–46.0)
Hemoglobin: 14.7 g/dL (ref 12.0–15.0)
Immature Granulocytes: 1 %
Lymphocytes Relative: 16 %
Lymphs Abs: 1.2 10*3/uL (ref 0.7–4.0)
MCH: 31.4 pg (ref 26.0–34.0)
MCHC: 33 g/dL (ref 30.0–36.0)
MCV: 95.3 fL (ref 80.0–100.0)
Monocytes Absolute: 0.4 10*3/uL (ref 0.1–1.0)
Monocytes Relative: 5 %
Neutro Abs: 5.5 10*3/uL (ref 1.7–7.7)
Neutrophils Relative %: 77 %
Platelets: 200 10*3/uL (ref 150–400)
RBC: 4.68 MIL/uL (ref 3.87–5.11)
RDW: 12.3 % (ref 11.5–15.5)
WBC: 7.1 10*3/uL (ref 4.0–10.5)
nRBC: 0 % (ref 0.0–0.2)

## 2021-07-30 LAB — BASIC METABOLIC PANEL
Anion gap: 10 (ref 5–15)
BUN: 24 mg/dL — ABNORMAL HIGH (ref 6–20)
CO2: 25 mmol/L (ref 22–32)
Calcium: 9.3 mg/dL (ref 8.9–10.3)
Chloride: 106 mmol/L (ref 98–111)
Creatinine, Ser: 0.68 mg/dL (ref 0.44–1.00)
GFR, Estimated: >60 mL/min (ref >60)
Glucose, Bld: 109 mg/dL — ABNORMAL HIGH (ref 70–99)
Potassium: 3.8 mmol/L (ref 3.5–5.1)
Sodium: 141 mmol/L (ref 135–145)

## 2021-07-30 LAB — D-DIMER, QUANTITATIVE: D-Dimer, Quant: 0.77 ug/mL-FEU — ABNORMAL HIGH (ref 0.00–0.50)

## 2021-07-30 LAB — RESP PANEL BY RT-PCR (FLU A&B, COVID) ARPGX2
Influenza A by PCR: NEGATIVE
Influenza B by PCR: NEGATIVE
SARS Coronavirus 2 by RT PCR: NEGATIVE

## 2021-07-30 MED ORDER — IOHEXOL 350 MG/ML SOLN
75.0000 mL | Freq: Once | INTRAVENOUS | Status: AC | PRN
  Administered 2021-07-30: 75 mL via INTRAVENOUS

## 2021-07-30 MED ORDER — IPRATROPIUM-ALBUTEROL 0.5-2.5 (3) MG/3ML IN SOLN
3.0000 mL | Freq: Once | RESPIRATORY_TRACT | Status: AC
  Administered 2021-07-30: 3 mL via RESPIRATORY_TRACT
  Filled 2021-07-30: qty 3

## 2021-07-30 MED ORDER — PREDNISONE 10 MG PO TABS: 40.0000 mg | ORAL_TABLET | Freq: Every day | ORAL | 0 refills | Status: AC

## 2021-07-30 MED ORDER — BENZONATATE 100 MG PO CAPS: 100.0000 mg | ORAL_CAPSULE | Freq: Three times a day (TID) | ORAL | 0 refills | Status: DC

## 2021-07-30 MED ORDER — METHYLPREDNISOLONE SODIUM SUCC 125 MG IJ SOLR
125.0000 mg | Freq: Once | INTRAMUSCULAR | Status: AC
  Administered 2021-07-30: 125 mg via INTRAVENOUS
  Filled 2021-07-30: qty 2

## 2021-07-30 NOTE — ED Notes (Signed)
Patient transported to X-ray 

## 2021-07-30 NOTE — ED Provider Notes (Signed)
[facility] EMERGENCY DEPARTMENT Provider Note   CSN: [id number] Arrival date & time: 07/30/21  1254     History  Chief Complaint  Patient presents with   Cough    [name] is a 61 y.o. female.  Patient has past medical history of hypertension, hypothyroidism, asthma, history of remote melanoma.  She presents emergency department with symptoms of worsening shortness of breath, and cough over the past several days.  She originally got sick on December 24 with upper respiratory symptoms.  She states that a lot of her flulike symptoms have resolved, however the cough and shortness of breath have persisted.  Cough is nonproductive.  She does describe some chest tightness occasionally with deep breaths.  She was seen by her primary care provider earlier today where they gave her breathing treatment because they heard wheezing in her lungs.  They wanted her to be evaluated for pneumonia and a possible blood clot in her lungs.  Patient denies any leg swelling, recent surgery, recent immobilization, history of blood clots, treatment for cancer in the past 6 months, hemoptysis.   Cough Associated symptoms: shortness of breath and wheezing       Home Medications Prior to Admission medications   Medication Sig Start Date End Date Taking? Authorizing Provider  acetaminophen (TYLENOL) 500 MG tablet Take 500 mg by mouth as needed.    [provider]  albuterol (PROAIR HFA) 108 (90 Base) MCG/ACT inhaler Inhale 2 puffs into the lungs every 4 (four) hours as needed for wheezing or shortness of breath. Patient taking differently: Inhale 2 puffs into the lungs as needed for wheezing or shortness of breath. 12/15/15   Charlies Silvers, MD  celecoxib (CELEBREX) 200 MG capsule Take 200 mg by mouth as needed. 05/28/21   [provider]  cetirizine (ZYRTEC) 10 MG tablet Take 10 mg by mouth daily.    [provider]  cyclobenzaprine (FLEXERIL) 10 MG tablet Take 10 mg by mouth as  needed. 05/28/21   [provider]  diazepam (VALIUM) 2 MG tablet Take 2 mg by mouth as needed for anxiety.    [provider]  diltiazem (CARDIZEM CD) 180 MG 24 hr capsule Take 180 mg by mouth at bedtime. 10/10/19   [provider]  diphenhydrAMINE (BENADRYL) 25 MG tablet Take 25 mg by mouth as needed.    [provider]  EPINEPHrine 0.3 mg/0.3 mL IJ SOAJ injection Inject 0.3 mLs (0.3 mg total) into the muscle once. 12/15/15   Charlies Silvers, MD  levothyroxine (SYNTHROID, LEVOTHROID) 75 MCG tablet Take 100 mcg by mouth daily before breakfast.    [provider]  metoprolol tartrate (LOPRESSOR) 50 MG tablet Take 50 mg by mouth as needed (BP higher than 140/89). Only takes if bp elevated. 03/29/18   [provider]  olmesartan (BENICAR) 20 MG tablet Take 20 mg by mouth daily. 04/29/21   [provider]  Probiotic Product (PROBIOTIC GUMMIES) 30 MG CHEW Chew by mouth. Chews 2 gummies daily.    [provider]  rosuvastatin (CRESTOR) 5 MG tablet at bedtime. 05/14/21   [provider]  scopolamine (TRANSDERM-SCOP) 1 MG/3DAYS as needed. 05/28/21   [provider]      Allergies    Peanut-containing drug products, Dilaudid [hydromorphone hcl], Morphine and related, and Phenergan [promethazine hcl]    Review of Systems   Review of Systems  Respiratory:  Positive for cough, chest tightness, shortness of breath and wheezing.   All  other systems reviewed and are negative.  Physical Exam Updated Vital Signs BP (!) 145/79    Pulse 84    Temp 97.8 F (36.6 C)    Resp 20    Ht 5\' 5"  (1.651 m)    Wt 64.9 kg    SpO2 100%    BMI 23.80 kg/m  Physical Exam Vitals and nursing note reviewed.  Constitutional:      General: She is not in acute distress.    Appearance: Normal appearance. She is not ill-appearing, toxic-appearing or diaphoretic.  HENT:     Head: Normocephalic and atraumatic.     Nose: No nasal deformity.      Mouth/Throat:     Lips: Pink. No lesions.     Mouth: No injury, lacerations, oral lesions or angioedema.     Pharynx: Uvula midline. No pharyngeal swelling or uvula swelling.  Eyes:     General: Gaze aligned appropriately. No scleral icterus.       Right eye: No discharge.        Left eye: No discharge.     Conjunctiva/sclera: Conjunctivae normal.     Right eye: Right conjunctiva is not injected. No exudate or hemorrhage.    Left eye: Left conjunctiva is not injected. No exudate or hemorrhage. Cardiovascular:     Rate and Rhythm: Normal rate and regular rhythm.     Pulses: Normal pulses.          Radial pulses are 2+ on the right side and 2+ on the left side.       Dorsalis pedis pulses are 2+ on the right side and 2+ on the left side.     Heart sounds: Normal heart sounds, S1 normal and S2 normal. Heart sounds not distant. No murmur heard.   No friction rub. No gallop. No S3 or S4 sounds.  Pulmonary:     Effort: Pulmonary effort is normal. No accessory muscle usage or respiratory distress.     Breath sounds: No stridor. Wheezing present. No rhonchi or rales.     Comments: Patient is noted to have bilateral wheezing in all lung fields.  She is not tachypneic or having increased work of breathing.  Accessory muscles are not used. Chest:     Chest wall: No tenderness.  Abdominal:     General: Abdomen is flat. Bowel sounds are normal. There is no distension.     Palpations: Abdomen is soft. There is no mass or pulsatile mass.     Tenderness: There is no abdominal tenderness. There is no guarding or rebound.  Musculoskeletal:     Right lower leg: No edema.     Left lower leg: No edema.     Comments: No asymmetric lower extremity leg swelling  Skin:    General: Skin is warm and dry.     Coloration: Skin is not jaundiced or pale.     Findings: No bruising, erythema, lesion or rash.  Neurological:     General: No focal deficit present.     Mental Status: She is alert and oriented to  person, place, and time.     GCS: GCS eye subscore is 4. GCS verbal subscore is 5. GCS motor subscore is 6.  Psychiatric:        Mood and Affect: Mood normal.        Behavior: Behavior normal. Behavior is cooperative.    ED Results / Procedures / Treatments   Labs (all labs ordered are listed, but only abnormal results  are displayed) Labs Reviewed  RESP PANEL BY RT-PCR (FLU A&B, COVID) ARPGX2  CBC WITH DIFFERENTIAL/PLATELET  BASIC METABOLIC PANEL  D-DIMER, QUANTITATIVE    EKG None  Radiology DG Chest 2 View  Result Date: 07/30/2021 CLINICAL DATA:  Short of breath and cough 1 week EXAM: CHEST - 2 VIEW COMPARISON:  12/08/2020 FINDINGS: The heart size and mediastinal contours are within normal limits. Both lungs are clear. The visualized skeletal structures are unremarkable. IMPRESSION: No active cardiopulmonary disease. Electronically Signed   By: Franchot Gallo M.D.   On: 07/30/2021 13:53    Procedures Procedures   Medications Ordered in ED Medications  methylPREDNISolone sodium succinate (SOLU-MEDROL) 125 mg/2 mL injection 125 mg (has no administration in time range)  ipratropium-albuterol (DUONEB) 0.5-2.5 (3) MG/3ML nebulizer solution 3 mL (has no administration in time range)    ED Course/ Medical Decision Making/ A&P                           Medical Decision Making Amount and/or Complexity of Data Reviewed Labs: ordered. Decision-making details documented in ED Course. Radiology: ordered and independent interpretation performed. Decision-making details documented in ED Course. ECG/medicine tests: ordered and independent interpretation performed. Decision-making details documented in ED Course.  Risk OTC drugs. Prescription drug management.   This is a 61 y.o. female with a PMH of hypertension, hypothyroidism, asthma, history of remote melanoma who presents to the ED with worsening shortness of breath and cough after having upper respiratory infection about 1 week  ago.  She was placed on 2 antibiotics over the past 2 weeks that have not improved her symptoms.  She was sent over here by her primary care provider who heard diffuse wheezing on her exam.  She was given a breathing treatment prior to arrival.  They wanted her to be ruled out for blood clots or pneumonia.  Vitals: Patient is afebrile.  She is not tachycardic.  She is hemodynamically stable.  Oxygenating 100% on room air.  Exam: Fuhs bilateral wheezing in all lung fields present.  Patient is without increased work of breathing or tachypnea.  Initial Impression: I think it is likely that this is bronchitis the chest x-ray is clear patient still has wheezing present on exam.  I think that overall has a pretty low risk for having a pulmonary embolism outside of having a remote history of melanoma. Wells score is 0.  However patient is concerned for this, so we will obtain a D-dimer to risk stratify.  I will also provide patient with a second nebulizer here and one-time dose of Solu-Medrol or symptoms.  She may also be having exasperation of her asthma as well as bronchitis.  I personally reviewed all laboratory work and imaging. Abnormal results outlined below. CBC, BMP unremarkable. Resp panel negative. D dimer is elevated. CXR without abnormality.  I obtained CTA chest which was negative for PE and did not show evidence of PNA.   On reassessment, wheezing is improved from prior. Patient if feeling a lot better. I think that she likely has bronchitis and some flare up from her asthma. Will discharge home with antitussives and prednisone course  Portions of this note were generated with Dragon dictation software. Dictation errors may occur despite best attempts at proofreading.   Final Clinical Impression(s) / ED Diagnoses Final diagnoses:  None    Rx / DC Orders ED Discharge Orders     None  Sheila Oats 07/30/21 2300    Hayden Rasmussen, MD 07/31/21 1226

## 2021-07-30 NOTE — Discharge Instructions (Addendum)
You have been seen in the ED and your CT imaging was negative for blood clots or pneumonia. I suspect that you have a viral bronchitis that is causing your symptoms to worsen. I have prescribed you a prescription for a cough suppressant and a short course of steroid therapy to help treat the inflammation in your lungs. Please use your home inhaler as needed when feeling like you are starting to wheeze. Please return to the Emergency department if your symptoms start to worsen such as developing worsening productive cough, breathing difficulties, or chest pain.

## 2021-07-30 NOTE — ED Triage Notes (Signed)
Cough and rib pain. Received breathing tx at PCP office pta.

## 2021-08-17 DIAGNOSIS — E559 Vitamin D deficiency, unspecified: Secondary | ICD-10-CM | POA: Diagnosis not present

## 2021-08-17 DIAGNOSIS — I341 Nonrheumatic mitral (valve) prolapse: Secondary | ICD-10-CM | POA: Diagnosis not present

## 2021-08-17 DIAGNOSIS — E890 Postprocedural hypothyroidism: Secondary | ICD-10-CM | POA: Diagnosis not present

## 2021-08-17 DIAGNOSIS — J4520 Mild intermittent asthma, uncomplicated: Secondary | ICD-10-CM | POA: Diagnosis not present

## 2021-08-17 DIAGNOSIS — Z0000 Encounter for general adult medical examination without abnormal findings: Secondary | ICD-10-CM | POA: Diagnosis not present

## 2021-08-17 DIAGNOSIS — Z1331 Encounter for screening for depression: Secondary | ICD-10-CM | POA: Diagnosis not present

## 2021-08-17 DIAGNOSIS — E538 Deficiency of other specified B group vitamins: Secondary | ICD-10-CM | POA: Diagnosis not present

## 2021-08-17 DIAGNOSIS — R7309 Other abnormal glucose: Secondary | ICD-10-CM | POA: Diagnosis not present

## 2021-08-17 DIAGNOSIS — E663 Overweight: Secondary | ICD-10-CM | POA: Diagnosis not present

## 2021-08-17 DIAGNOSIS — R002 Palpitations: Secondary | ICD-10-CM | POA: Diagnosis not present

## 2021-08-17 DIAGNOSIS — E782 Mixed hyperlipidemia: Secondary | ICD-10-CM | POA: Diagnosis not present

## 2021-08-17 DIAGNOSIS — Z6825 Body mass index (BMI) 25.0-25.9, adult: Secondary | ICD-10-CM | POA: Diagnosis not present

## 2021-08-19 NOTE — Telephone Encounter (Signed)
Spoke to pt.  Offered to schedule procedure with Dr. Gala Romney on 08/24/2021.  Pt declined.  She said she's been really sick.  She is aware that I will call her once Mar/Apr procedure schedules are available.

## 2021-08-21 DIAGNOSIS — M5416 Radiculopathy, lumbar region: Secondary | ICD-10-CM | POA: Diagnosis not present

## 2021-08-21 DIAGNOSIS — Z6825 Body mass index (BMI) 25.0-25.9, adult: Secondary | ICD-10-CM | POA: Diagnosis not present

## 2021-08-21 DIAGNOSIS — M79605 Pain in left leg: Secondary | ICD-10-CM | POA: Diagnosis not present

## 2021-08-21 DIAGNOSIS — I10 Essential (primary) hypertension: Secondary | ICD-10-CM | POA: Diagnosis not present

## 2021-08-21 DIAGNOSIS — Z981 Arthrodesis status: Secondary | ICD-10-CM | POA: Diagnosis not present

## 2021-09-14 DIAGNOSIS — H04123 Dry eye syndrome of bilateral lacrimal glands: Secondary | ICD-10-CM | POA: Diagnosis not present

## 2021-09-24 NOTE — Telephone Encounter (Signed)
Spoke to pt.  Scheduled procedure to 10/19/2021 at 9:00, arrival at 7:30 at [facility].  Pt made aware that I am mailing out prep instructions.  Confirmed mailing address.  ? ?No changes in medications.  Pt had Covid in December after returning from a cruise.  Was on Prednisone for 30 days but off of it now.   ?

## 2021-09-25 ENCOUNTER — Encounter: Payer: Self-pay | Admitting: *Deleted

## 2021-09-25 ENCOUNTER — Other Ambulatory Visit: Payer: Self-pay | Admitting: *Deleted

## 2021-10-06 ENCOUNTER — Telehealth: Payer: Self-pay | Admitting: Cardiology

## 2021-10-06 NOTE — Telephone Encounter (Signed)
Spoke to pt who stated that she felt a "skipping" sensation for a few seconds this evening followed by a cold feeling under her left breast area. Pt denies sob, nausea, dizziness. Pt aware that if she should start experiencing the above mentioned symptoms, she should seek an emergent evaluation in the ED. Pt also verbalized agreement to start keeping a log of hr/bp readings. Will fwd to provider for review/recommendations.  ?

## 2021-10-06 NOTE — Telephone Encounter (Signed)
Patient c/o Palpitations:  High priority if patient c/o lightheadedness, shortness of breath, or chest pain ? ?How long have you had palpitations/irregular HR/ Afib? Are you having the symptoms now? 20-30 min ago; no  ? ?Are you currently experiencing lightheadedness, SOB or CP? Discomfort; burn/cold feeling under left breast ? ?Do you have a history of afib (atrial fibrillation) or irregular heart rhythm? Yes  ? ?Have you checked your BP or HR? (document readings if available): no  ? ?Are you experiencing any other symptoms? No   ?

## 2021-10-07 NOTE — Telephone Encounter (Signed)
Satira Sark, MD:  ? ?Last seen in 2021 so likely needs a follow-up visit. Would keep an eye on symptoms and vitals for review.   ? ? ?Pt has appt with provider on 10/08/2021 @ 1pm in the [place] office.  ?

## 2021-10-08 ENCOUNTER — Ambulatory Visit: Payer: 59 | Admitting: Cardiology

## 2021-10-08 ENCOUNTER — Encounter: Payer: Self-pay | Admitting: Cardiology

## 2021-10-08 VITALS — BP 122/78 | HR 73 | Ht 65.0 in | Wt 145.8 lb

## 2021-10-08 DIAGNOSIS — I10 Essential (primary) hypertension: Secondary | ICD-10-CM

## 2021-10-08 DIAGNOSIS — R002 Palpitations: Secondary | ICD-10-CM | POA: Diagnosis not present

## 2021-10-08 MED ORDER — DILTIAZEM HCL ER COATED BEADS 240 MG PO CP24: 240.0000 mg | ORAL_CAPSULE | Freq: Every day | ORAL | 3 refills | Status: DC

## 2021-10-08 NOTE — Progress Notes (Signed)
? ? ?Cardiology Office Note ? ?Date: 10/08/2021  ? ?ID: [name], DOB 01/10/1961, MRN [id number] ? ?PCP:  Sharilyn Sites, MD  ?Cardiologist:  Rozann Lesches, MD ?Electrophysiologist:  None  ? ?Chief Complaint  ?Patient presents with  ? Cardiac follow-up  ? ? ?History of Present Illness: ?[name] is a 61 y.o. female last seen in April 2021.  She presents for follow-up of palpitations.  She states that she has had brief episodes of palpitations and sometimes a "cold" sensation in her chest, usually lasts only a few seconds.  She has not had any syncope and there have not been any obvious triggers.  She did have COVID-19 in the interim after getting back from a cruise.  At this point she has no other specific symptoms. ? ?She has been taking her medications regularly.  At this point she continues on Cardizem CD 180 mg daily, has not used Lopressor in a few years (had been taking when her blood pressure was elevated, not specifically for palpitations).  The remainder of her medications are noted below. ? ?I did review recent home blood pressure and heart rate checks, systolics 638V to 564P and heart rate in the 80s.  I personally reviewed her ECG today which shows normal sinus rhythm. ? ?Past Medical History:  ?Diagnosis Date  ? Asthma   ? Atypical nevus 10/26/2012  ? 1.RIGHT ANT THIGH -MODERATE, 2.RIGHT UPPER ABDOMEN- MILD, 3.RIGHT LABIA -MILD, 4.RIGHT UPPER BACK -MILD  ? Chronic back pain   ? Essential hypertension   ? History of cardiac catheterization 2011  ? Reportedly normal coronary arteries - [facility]  ? History of kidney stones   ? History of mitral valve prolapse   ? History of skin cancer   ? Hypothyroidism   ? Urinary incontinence 08/12/2015  ? ? ?Past Surgical History:  ?Procedure Laterality Date  ? ABDOMINAL HYSTERECTOMY    ? BACK SURGERY    ? x3-ruptured disc  ? Bone spur Right   ? Foot  ? CARDIAC CATHETERIZATION  11/2009  ? Normal coronary arteries ([facility])  ? CHOLECYSTECTOMY    ? COLONOSCOPY N/A  07/15/2014  ? Procedure: COLONOSCOPY;  Surgeon: Daneil Dolin, MD;  Location: [facility];  Service: Endoscopy;  Laterality: N/A;  ? CYST REMOVAL HAND Right   ? KNEE ARTHROSCOPY Left   ? ? ?Current Outpatient Medications  ?Medication Sig Dispense Refill  ? acetaminophen (TYLENOL) 500 MG tablet Take 500 mg by mouth as needed.    ? albuterol (PROAIR HFA) 108 (90 Base) MCG/ACT inhaler Inhale 2 puffs into the lungs every 4 (four) hours as needed for wheezing or shortness of breath. (Patient taking differently: Inhale 2 puffs into the lungs as needed for wheezing or shortness of breath.) 1 Inhaler 3  ? benzonatate (TESSALON PERLES) 100 MG capsule Take 100 mg by mouth as needed for cough.    ? cetirizine (ZYRTEC) 10 MG tablet Take 10 mg by mouth daily.    ? cyclobenzaprine (FLEXERIL) 10 MG tablet Take 10 mg by mouth as needed.    ? diazepam (VALIUM) 2 MG tablet Take 2 mg by mouth as needed for anxiety.    ? diphenhydrAMINE (BENADRYL) 25 MG tablet Take 25 mg by mouth as needed.    ? EPINEPHrine 0.3 mg/0.3 mL IJ SOAJ injection Inject 0.3 mLs (0.3 mg total) into the muscle once. 2 Device 2  ? levothyroxine (SYNTHROID, LEVOTHROID) 75 MCG tablet Take 100 mcg by mouth daily before breakfast.    ?  olmesartan (BENICAR) 20 MG tablet Take 20 mg by mouth daily.    ? rosuvastatin (CRESTOR) 5 MG tablet Take 5 mg by mouth at bedtime.    ? diltiazem (CARDIZEM CD) 240 MG 24 hr capsule Take 1 capsule (240 mg total) by mouth daily. 90 capsule 3  ? ?No current facility-administered medications for this visit.  ? ?Allergies:  Peanut-containing drug products, Dilaudid [hydromorphone hcl], Morphine and related, and Phenergan [promethazine hcl]  ? ?ROS: No orthopnea or PND. ? ?Physical Exam: ?VS:  BP 122/78   Pulse 73   Ht '5\' 5"'$  (1.651 m)   Wt 145 lb 12.8 oz (66.1 kg)   SpO2 99%   BMI 24.26 kg/m? , BMI Body mass index is 24.26 kg/m?. ? ?Wt Readings from Last 3 Encounters:  ?10/08/21 145 lb 12.8 oz (66.1 kg)  ?07/30/21 143 lb (64.9 kg)   ?06/11/21 148 lb 3.2 oz (67.2 kg)  ?  ?General: Patient appears comfortable at rest. ?HEENT: Conjunctiva and lids normal, wearing a mask. ?Neck: Supple, no elevated JVP or carotid bruits, no thyromegaly. ?Lungs: Clear to auscultation, nonlabored breathing at rest. ?Cardiac: Regular rate and rhythm, no S3 or significant systolic murmur, no pericardial rub. ?Extremities: No pitting edema. ? ?ECG:  An ECG dated 12/08/2020 was personally reviewed today and demonstrated:  Sinus rhythm. ? ?Recent Labwork: ?07/30/2021: BUN 24; Creatinine, Ser 0.68; Hemoglobin 14.7; Platelets 200; Potassium 3.8; Sodium 141  ? ?Other Studies Reviewed Today: ? ?Echocardiogram 09/19/2018: ? 1. The left ventricle has hyperdynamic systolic function, with an  ?ejection fraction of >65%. The cavity size was normal. Left ventricular  ?diastolic parameters were normal. No evidence of left ventricular regional  ?wall motion abnormalities.  ? 2. The right ventricle has normal systolic function. The cavity was  ?normal. There is no increase in right ventricular wall thickness. Right  ?ventricular systolic pressure. Normal estimated RVSP with an estimated  ?pressure of 16.4 mmHg.  ? 3. The mitral valve is normal in structure. No diagnostic evidence of  ?mitral valve prolapse.  ? 4. The tricuspid valve is normal in structure.  ? 5. The aortic valve is tricuspid.  ? 6. The aortic root is normal in size and structure. ? ?Cardiac monitor March 2020: ?7-day event recorder reviewed.  Sinus rhythm was present throughout.  Heart rate ranged from 44 bpm up to 136 bpm with average heart rate 73 bpm.  Rare PACs and PVCs were noted,each representing less than 1% of total beats.  Ectopic beats were isolated with the exception of a brief 5 beat burst of SVT and a few atrial couplets.  No obvious atrial fibrillation or atrial flutter.  No pauses.  ? ?Assessment and Plan: ? ?1.  Palpitations as described above, no associated syncope and ECG is normal today.  Cardiac  monitor from 2020 showed rare atrial and ventricular ectopy with brief episodes of SVT.  For now we will increase Cardizem CD to 240 mg daily and observe.  Could always consider follow-up monitoring if symptoms persist or worsen. ? ?2.  Essential hypertension, blood pressure control is adequate today.  She is also on Benicar.  No changes were made. ? ?Medication Adjustments/Labs and Tests Ordered: ?Current medicines are reviewed at length with the patient today.  Concerns regarding medicines are outlined above.  ? ?Tests Ordered: ?Orders Placed This Encounter  ?Procedures  ? EKG 12-Lead  ? ? ?Medication Changes: ?Meds ordered this encounter  ?Medications  ? diltiazem (CARDIZEM CD) 240 MG 24 hr capsule  ?  Sig: Take 1 capsule (240 mg total) by mouth daily.  ?  Dispense:  90 capsule  ?  Refill:  3  ?  10/08/21 Dose Increase  ? ? ?Disposition:  Follow up  6 months. ? ?Signed, ?Satira Sark, MD, [facility] ?10/08/2021 1:25 PM    ?[place] [facility] ?[address], [place], [place] [address] ?Phone: [phone]; Fax: [phone]  ?

## 2021-10-08 NOTE — Patient Instructions (Signed)
Medication Instructions:  ?Your physician has recommended you make the following change in your medication:  ?Increase Cardizem to 240 mg once a day ?Continue other medications as directed ? ?Labwork: ?none ? ?Testing/Procedures: ?none ? ?Follow-Up: ?Your physician recommends that you schedule a follow-up appointment in: 6 months ? ?Any Other Special Instructions Will Be Listed Below (If Applicable). ? ?If you need a refill on your cardiac medications before your next appointment, please call your pharmacy. ? ?

## 2021-10-19 ENCOUNTER — Other Ambulatory Visit: Payer: Self-pay

## 2021-10-19 ENCOUNTER — Encounter (HOSPITAL_COMMUNITY): Payer: Self-pay | Admitting: Internal Medicine

## 2021-10-19 ENCOUNTER — Ambulatory Visit (HOSPITAL_BASED_OUTPATIENT_CLINIC_OR_DEPARTMENT_OTHER): Payer: 59 | Admitting: Anesthesiology

## 2021-10-19 ENCOUNTER — Ambulatory Visit (HOSPITAL_COMMUNITY)
Admission: RE | Admit: 2021-10-19 | Discharge: 2021-10-19 | Disposition: A | Discharge status: Home / Self Care | Payer: 59 | Source: Ambulatory Visit | Attending: Internal Medicine | Admitting: Internal Medicine

## 2021-10-19 ENCOUNTER — Ambulatory Visit (HOSPITAL_COMMUNITY): Payer: 59 | Admitting: Anesthesiology

## 2021-10-19 ENCOUNTER — Encounter (HOSPITAL_COMMUNITY)
Admission: RE | Disposition: A | Discharge status: Home / Self Care | Payer: Self-pay | Source: Ambulatory Visit | Attending: Internal Medicine

## 2021-10-19 DIAGNOSIS — I10 Essential (primary) hypertension: Secondary | ICD-10-CM | POA: Insufficient documentation

## 2021-10-19 DIAGNOSIS — Z1211 Encounter for screening for malignant neoplasm of colon: Secondary | ICD-10-CM | POA: Insufficient documentation

## 2021-10-19 DIAGNOSIS — E039 Hypothyroidism, unspecified: Secondary | ICD-10-CM | POA: Diagnosis not present

## 2021-10-19 DIAGNOSIS — Z800 Family history of malignant neoplasm of digestive organs: Secondary | ICD-10-CM | POA: Insufficient documentation

## 2021-10-19 HISTORY — DX: Cardiac arrhythmia, unspecified: I49.9

## 2021-10-19 HISTORY — PX: COLONOSCOPY WITH PROPOFOL: SHX5780

## 2021-10-19 HISTORY — DX: Other complications of anesthesia, initial encounter: T88.59XA

## 2021-10-19 SURGERY — COLONOSCOPY WITH PROPOFOL
Anesthesia: General

## 2021-10-19 MED ORDER — PROPOFOL 10 MG/ML IV BOLUS
INTRAVENOUS | Status: DC | PRN
  Administered 2021-10-19: 60 mg via INTRAVENOUS

## 2021-10-19 MED ORDER — PROPOFOL 500 MG/50ML IV EMUL
INTRAVENOUS | Status: DC | PRN
  Administered 2021-10-19: 150 ug/kg/min via INTRAVENOUS

## 2021-10-19 MED ORDER — LACTATED RINGERS IV SOLN: INTRAVENOUS | Status: DC

## 2021-10-19 NOTE — Op Note (Signed)
[facility] ?Patient Name: [name] ?Procedure Date: 10/19/2021 8:57 AM ?MRN: [id number] ?Date of Birth: Jun 13, 1961 ?Attending MD: Norvel Richards , MD ?CSN: [id number] ?Age: 61 ?Admit Type: Outpatient ?Procedure:                Colonoscopy ?Indications:              Colon cancer screening in patient at increased  ?                          risk: Family history of colorectal cancer in  ?                          multiple 2nd degree relatives ?Providers:                Norvel Richards, MD, Charlsie Quest Theda Sers RN, Therapist, sports,  ?                          Randa Spike, Technician ?Referring MD:              ?Medicines:                Propofol per Anesthesia ?Complications:            No immediate complications. ?Estimated Blood Loss:     Estimated blood loss: none. ?Procedure:                Pre-Anesthesia Assessment: ?                          - Prior to the procedure, a History and Physical  ?                          was performed, and patient medications and  ?                          allergies were reviewed. The patient's tolerance of  ?                          previous anesthesia was also reviewed. The risks  ?                          and benefits of the procedure and the sedation  ?                          options and risks were discussed with the patient.  ?                          All questions were answered, and informed consent  ?                          was obtained. Prior Anticoagulants: The patient has  ?                          taken no previous anticoagulant or antiplatelet  ?  agents. ASA Grade Assessment: II - A patient with  ?                          mild systemic disease. After reviewing the risks  ?                          and benefits, the patient was deemed in  ?                          satisfactory condition to undergo the procedure. ?                          After obtaining informed consent, the colonoscope  ?                          was passed  under direct vision. Throughout the  ?                          procedure, the patient's blood pressure, pulse, and  ?                          oxygen saturations were monitored continuously. The  ?                          [id number]) scope was introduced through the  ?                          anus and advanced to the the cecum, identified by  ?                          appendiceal orifice and ileocecal valve. The  ?                          colonoscopy was performed without difficulty. The  ?                          patient tolerated the procedure well. The quality  ?                          of the bowel preparation was adequate. ?Scope In: 9:22:40 AM ?Scope Out: 9:33:58 AM ?Scope Withdrawal Time: 0 hours 6 minutes 20 seconds  ?Total Procedure Duration: 0 hours 11 minutes 18 seconds  ?Findings: ?     The perianal and digital rectal examinations were normal. ?     The exam was otherwise without abnormality on direct and retroflexion  ?     views. ?Impression:               - The examination was otherwise normal on direct  ?                          and retroflexion views. ?                          - No specimens collected. ?Moderate Sedation: ?  Moderate (conscious) sedation was personally administered by an  ?     anesthesia professional. The following parameters were monitored: oxygen  ?     saturation, heart rate, blood pressure, and response to care. ?Recommendation:           - Patient has a contact number available for  ?                          emergencies. The signs and symptoms of potential  ?                          delayed complications were discussed with the  ?                          patient. Return to normal activities tomorrow.  ?                          Written discharge instructions were provided to the  ?                          patient. ?                          - Resume previous diet. ?                          - Continue present medications. ?                          -  Repeat colonoscopy in 5 years for screening  ?                          purposes. ?                          - Return to GI office in 6 months. For  ?                          constipation, Benefiber 2 tablespoons daily.  ?                          MiraLAX 17 g orally at bedtime. We will see her  ?                          back in the office and discussed a genetic  ?                          consultation. ?Procedure Code(s):        --- Professional --- ?                          [phone], Colonoscopy, flexible; diagnostic, including  ?                          collection of specimen(s) by brushing or washing,  ?  when performed (separate procedure) ?Diagnosis Code(s):        --- Professional --- ?                          Z80.0, Family history of malignant neoplasm of  ?                          digestive organs ?CPT copyright 2019 [employer] Medical Association. All rights reserved. ?The codes documented in this report are preliminary and upon coder review may  ?be revised to meet current compliance requirements. ?Cristopher Estimable. Janara Klett, MD ?Norvel Richards, MD ?10/19/2021 9:43:11 AM ?This report has been signed electronically. ?Number of Addenda: 0 ?

## 2021-10-19 NOTE — Discharge Instructions (Addendum)
?  Colonoscopy ?Discharge Instructions ? ?Read the instructions outlined below and refer to this sheet in the next few weeks. These discharge instructions provide you with general information on caring for yourself after you leave the hospital. Your doctor may also give you specific instructions. While your treatment has been planned according to the most current medical practices available, unavoidable complications occasionally occur. If you have any problems or questions after discharge, call Dr. Gala Romney at [phone]. ?ACTIVITY ?You may resume your regular activity, but move at a slower pace for the next 24 hours.  ?Take frequent rest periods for the next 24 hours.  ?Walking will help get rid of the air and reduce the bloated feeling in your belly (abdomen).  ?No driving for 24 hours (because of the medicine (anesthesia) used during the test).   ?Do not sign any important legal documents or operate any machinery for 24 hours (because of the anesthesia used during the test).  ?NUTRITION ?Drink plenty of fluids.  ?You may resume your normal diet as instructed by your doctor.  ?Begin with a light meal and progress to your normal diet. Heavy or fried foods are harder to digest and may make you feel sick to your stomach (nauseated).  ?Avoid alcoholic beverages for 24 hours or as instructed.  ?MEDICATIONS ?You may resume your normal medications unless your doctor tells you otherwise.  ?WHAT YOU CAN EXPECT TODAY ?Some feelings of bloating in the abdomen.  ?Passage of more gas than usual.  ?Spotting of blood in your stool or on the toilet paper.  ?IF YOU HAD POLYPS REMOVED DURING THE COLONOSCOPY: ?No aspirin products for 7 days or as instructed.  ?No alcohol for 7 days or as instructed.  ?Eat a soft diet for the next 24 hours.  ?FINDING OUT THE RESULTS OF YOUR TEST ?Not all test results are available during your visit. If your test results are not back during the visit, make an appointment with your caregiver to find out the  results. Do not assume everything is normal if you have not heard from your caregiver or the medical facility. It is important for you to follow up on all of your test results.  ?SEEK IMMEDIATE MEDICAL ATTENTION IF: ?You have more than a spotting of blood in your stool.  ?Your belly is swollen (abdominal distention).  ?You are nauseated or vomiting.  ?You have a temperature over 101.  ?You have abdominal pain or discomfort that is severe or gets worse throughout the day.   ? ?Your colon appeared normal today ? ?It is recommended you return for repeat colonoscopy in 5 years ? ?Constipation information provided ? ?Begin Benefiber 1 tablespoon daily for 3 weeks; then increase to 2 tablespoons daily thereafter.  Take every day. ? ?May use MiraLAX 17 g orally or 1 capful in 8 ounces water at bedtime as needed for constipation not managed with Benefiber alone ? ?At patient request, I called [facility] at [phone] with 3-reviewed findings and recommendations ? ?Office visit with [place] in 6 months- The office will call you to schedule this ?

## 2021-10-19 NOTE — Anesthesia Preprocedure Evaluation (Signed)
Anesthesia Evaluation  ?Patient identified by MRN, date of birth, ID band ?Patient awake ? ? ? ?Reviewed: ?Allergy & Precautions, H&P , NPO status , Patient's Chart, lab work & pertinent test results, reviewed documented beta blocker date and time  ? ?History of Anesthesia Complications ?(+) PROLONGED EMERGENCE and history of anesthetic complications ? ?Airway ?Mallampati: II ? ?TM Distance: >3 FB ?Neck ROM: full ? ? ? Dental ?no notable dental hx. ? ?  ?Pulmonary ?neg pulmonary ROS,  ?  ?Pulmonary exam normal ?breath sounds clear to auscultation ? ? ? ? ? ? Cardiovascular ?Exercise Tolerance: Good ?hypertension, negative cardio ROS ? ? ?Rhythm:regular Rate:Normal ? ? ?  ?Neuro/Psych ?negative neurological ROS ? negative psych ROS  ? GI/Hepatic ?negative GI ROS, Neg liver ROS,   ?Endo/Other  ?Hypothyroidism  ? Renal/GU ?negative Renal ROS  ?negative genitourinary ?  ?Musculoskeletal ? ? Abdominal ?  ?Peds ? Hematology ?negative hematology ROS ?(+)   ?Anesthesia Other Findings ?Pt with mild allergic dermatitis on lowe back.  No systemic symptoms.  Patient has a history of multiple environmental and food allergies. ? Reproductive/Obstetrics ?negative OB ROS ? ?  ? ? ? ? ? ? ? ? ? ? ? ? ? ?  ?  ? ? ? ? ? ? ? ? ?Anesthesia Physical ?Anesthesia Plan ? ?ASA: 2 ? ?Anesthesia Plan: General  ? ?Post-op Pain Management:   ? ?Induction:  ? ?PONV Risk Score and Plan: Propofol infusion ? ?Airway Management Planned:  ? ?Additional Equipment:  ? ?Intra-op Plan:  ? ?Post-operative Plan:  ? ?Informed Consent: I have reviewed the patients History and Physical, chart, labs and discussed the procedure including the risks, benefits and alternatives for the proposed anesthesia with the patient or authorized representative who has indicated his/her understanding and acceptance.  ? ? ? ?Dental Advisory Given ? ?Plan Discussed with: CRNA ? ?Anesthesia Plan Comments:   ? ? ? ? ? ? ?Anesthesia Quick  Evaluation ? ?

## 2021-10-19 NOTE — Transfer of Care (Signed)
Immediate Anesthesia Transfer of Care Note ? ?Patient: [name] ? ?Procedure(s) Performed: COLONOSCOPY WITH PROPOFOL ? ?Patient Location: PACU ? ?Anesthesia Type:General ? ?Level of Consciousness: awake, alert  and oriented ? ?Airway & Oxygen Therapy: Patient Spontanous Breathing ? ?Post-op Assessment: Report given to RN, Post -op Vital signs reviewed and stable, Patient moving all extremities X 4 and Patient able to stick tongue midline ? ?Post vital signs: Reviewed ? ?Last Vitals:  ?Vitals Value Taken Time  ?BP 106/56   ?Temp 36.0   ?Pulse 74   ?Resp 16   ?SpO2 99   ? ? ?Last Pain:  ?Vitals:  ? 10/19/21 0918  ?TempSrc:   ?PainSc: 0-No pain  ?   ? ?Patients Stated Pain Goal: 8 (10/19/21 0746) ? ?Complications: No notable events documented. ?

## 2021-10-19 NOTE — Anesthesia Postprocedure Evaluation (Signed)
Anesthesia Post Note ? ?Patient: JAYLEY HUSTEAD ? ?Procedure(s) Performed: COLONOSCOPY WITH PROPOFOL ? ?Patient location during evaluation: Phase II ?Anesthesia Type: General ?Level of consciousness: awake ?Pain management: pain level controlled ?Vital Signs Assessment: post-procedure vital signs reviewed and stable ?Respiratory status: spontaneous breathing and respiratory function stable ?Cardiovascular status: blood pressure returned to baseline and stable ?Postop Assessment: no headache and no apparent nausea or vomiting ?Anesthetic complications: no ?Comments: Late entry ? ? ?No notable events documented. ? ? ?Last Vitals:  ?Vitals:  ? 10/19/21 0746 10/19/21 0938  ?BP: 140/83 (!) 106/56  ?Pulse:  72  ?Resp: 17 16  ?Temp: 36.8 ?C 36.6 ?C  ?SpO2: 99% 99%  ?  ?Last Pain:  ?Vitals:  ? 10/19/21 0938  ?TempSrc: Oral  ?PainSc: 0-No pain  ? ? ?  ?  ?  ?  ?  ?  ? ?Louann Sjogren ? ? ? ? ?

## 2021-10-19 NOTE — H&P (Signed)
$'@LOGO'z$ @ ? ? ?Primary Care Physician:  Sharilyn Sites, MD ?Primary Gastroenterologist:  Dr. Gala Romney ? ?Pre-Procedure History & Physical: ?HPI:  [name] is a 61 y.o. female here for a high screening colonoscopy.  Multiple family members (second third-degree relatives with colon cancer by report.  No bowel symptoms.  Negative colonoscopy 2015. ?Only 1 sibling.  Had ulcer surgery as a child. ? ?Past Medical History:  ?Diagnosis Date  ? Asthma   ? Atypical nevus 10/26/2012  ? 1.RIGHT ANT THIGH -MODERATE, 2.RIGHT UPPER ABDOMEN- MILD, 3.RIGHT LABIA -MILD, 4.RIGHT UPPER BACK -MILD  ? Chronic back pain   ? Complication of anesthesia   ? Dysrhythmia   ? Essential hypertension   ? History of cardiac catheterization 2011  ? Reportedly normal coronary arteries - [facility]  ? History of kidney stones   ? History of mitral valve prolapse   ? History of skin cancer   ? Hypothyroidism   ? Urinary incontinence 08/12/2015  ? ? ?Past Surgical History:  ?Procedure Laterality Date  ? ABDOMINAL HYSTERECTOMY    ? BACK SURGERY    ? x3-ruptured disc  ? Bone spur Right   ? Foot  ? CARDIAC CATHETERIZATION  11/2009  ? Normal coronary arteries ([facility])  ? CHOLECYSTECTOMY    ? COLONOSCOPY N/A 07/15/2014  ? Procedure: COLONOSCOPY;  Surgeon: Daneil Dolin, MD;  [facility];  Service: Endoscopy;  Laterality: N/A;  ? CYST REMOVAL HAND Right   ? KNEE ARTHROSCOPY Left   ? ? ?Prior to Admission medications   ?Medication Sig Start Date End Date Taking? Authorizing Provider  ?acetaminophen (TYLENOL) 500 MG tablet Take 1,000 mg by mouth every 6 (six) hours as needed for moderate pain.   Yes [provider]  ?albuterol (PROAIR HFA) 108 (90 Base) MCG/ACT inhaler Inhale 2 puffs into the lungs every 4 (four) hours as needed for wheezing or shortness of breath. 12/15/15  Yes Bardelas, Jose A, MD  ?cetirizine (ZYRTEC) 10 MG tablet Take 10 mg by mouth daily in the afternoon.   Yes [provider]  ?diltiazem (CARDIZEM CD) 240 MG 24 hr  capsule Take 1 capsule (240 mg total) by mouth daily. ?Patient taking differently: Take 240 mg by mouth at bedtime. 10/08/21  Yes Satira Sark, MD  ?EPINEPHrine 0.3 mg/0.3 mL IJ SOAJ injection Inject 0.3 mLs (0.3 mg total) into the muscle once. ?Patient taking differently: Inject 0.3 mg into the muscle as needed for anaphylaxis. 12/15/15  Yes Bardelas, Jens Som, MD  ?levothyroxine (SYNTHROID) 100 MCG tablet Take 100 mcg by mouth See admin instructions. Take 100 mcg daily except skip dose on Sundays   Yes [provider]  ?olmesartan (BENICAR) 20 MG tablet Take 20 mg by mouth daily. 04/29/21  Yes [provider]  ?Polyethyl Glycol-Propyl Glycol (SYSTANE OP) Place 1 drop into both eyes daily as needed (dry eyes).   Yes [provider]  ?rosuvastatin (CRESTOR) 5 MG tablet Take 5 mg by mouth at bedtime. 05/14/21  Yes [provider]  ?benzonatate (TESSALON) 100 MG capsule Take 100 mg by mouth as needed for cough.    [provider]  ?cyclobenzaprine (FLEXERIL) 10 MG tablet Take 10 mg by mouth daily as needed for muscle spasms. 05/28/21   [provider]  ?diazepam (VALIUM) 2 MG tablet Take 2 mg by mouth as needed (anxiety when flying).    [provider]  ? ? ?Allergies as of 09/25/2021 - Review Complete 07/30/2021  ?Allergen Reaction Noted  ?  Peanut-containing drug products Hives and Shortness Of Breath 04/01/2016  ? Dilaudid [hydromorphone hcl] Nausea And Vomiting 06/20/2012  ? Morphine and related Nausea And Vomiting 06/20/2012  ? Phenergan [promethazine hcl] Nausea And Vomiting 06/06/2014  ? ? ?Family History  ?Problem Relation Age of Onset  ? COPD Mother   ? Cancer Mother   ?     skin  ? Hernia Mother   ? Fibromyalgia Mother   ? Hypertension Mother   ? Asthma Mother   ? Other Father   ?     open heart surgery  ? Hypertension Father   ? Hypertension Brother   ? Interstitial cystitis Daughter   ? Allergic rhinitis Daughter   ? Asthma Daughter   ? Cancer  Maternal Grandmother   ?     cervical  ? Heart attack Maternal Grandmother   ?     died at age 57  ? Cancer Maternal Grandfather   ?     colon  ? Cancer Paternal Grandmother   ?     colon, cervical,melonoma  ? Other Paternal Grandmother   ?     open heart surgery  ? Arthritis Paternal Grandfather   ? Leukemia Paternal Grandfather   ? ? ?Social History  ? ?Socioeconomic History  ? Marital status: Married  ?  Spouse name: Not on file  ? Number of children: Not on file  ? Years of education: Not on file  ? Highest education level: Not on file  ?Occupational History  ? Not on file  ?Tobacco Use  ? Smoking status: Never  ? Smokeless tobacco: Never  ?Vaping Use  ? Vaping Use: Never used  ?Substance and Sexual Activity  ? Alcohol use: No  ? Drug use: No  ? Sexual activity: Yes  ?  Birth control/protection: Surgical  ?  Comment: hyst  ?Other Topics Concern  ? Not on file  ?Social History Narrative  ? Not on file  ? ?Social Determinants of Health  ? ?Financial Resource Strain: Not on file  ?Food Insecurity: Not on file  ?Transportation Needs: Not on file  ?Physical Activity: Not on file  ?Stress: Not on file  ?Social Connections: Not on file  ?Intimate Partner Violence: Not on file  ? ? ?Review of Systems: ?See HPI, otherwise negative ROS ? ?Physical Exam: ?BP 140/83   Temp 98.3 ?F (36.8 ?C) (Oral)   Resp 17   Ht '5\' 5"'$  (1.651 m)   Wt 64.9 kg   SpO2 99%   BMI 23.80 kg/m?  ?General:   Alert,  Well-developed, well-nourished, pleasant and cooperative in NAD ?Mouth:  No deformity or lesions. ?Neck:  Supple; no masses or thyromegaly. No significant cervical adenopathy. ?Lungs:  Clear throughout to auscultation.   No wheezes, crackles, or rhonchi. No acute distress. ?Heart:  Regular rate and rhythm; no murmurs, clicks, rubs,  or gallops. ?Abdomen: Non-distended, normal bowel sounds.  Soft and nontender without appreciable mass or hepatosplenomegaly.  ?Pulses:  Normal pulses noted. ?Extremities:  Without clubbing or  edema. ? ?Impression/Plan: 61 year old lady here for high risk screening colonoscopy. ?I have offered the patient a screening colonoscopy today. ?The risks, benefits, limitations, alternatives and imponderables have been reviewed with the patient. Questions have been answered. All parties are agreeable.   ? ? ? ? ?Notice: This dictation was prepared with Dragon dictation along with smaller phrase technology. Any transcriptional errors that result from this process are unintentional and may not be corrected upon review.  ? ?

## 2021-10-19 NOTE — Progress Notes (Signed)
Pt arrived for colonoscopy c/o rash on trunk and arms stating from colon prep.  Pt denies itching at this time. Faint redness noted on back.  Dr. Briant Cedar and Dr. Gala Romney informed and agree to proceed with procedure.  Pt declined benadryl at this time. VSS and no other complaints.  ?

## 2021-10-21 ENCOUNTER — Encounter (HOSPITAL_COMMUNITY): Payer: Self-pay | Admitting: Internal Medicine

## 2021-12-14 DIAGNOSIS — Z6824 Body mass index (BMI) 24.0-24.9, adult: Secondary | ICD-10-CM | POA: Diagnosis not present

## 2021-12-14 DIAGNOSIS — J4520 Mild intermittent asthma, uncomplicated: Secondary | ICD-10-CM | POA: Diagnosis not present

## 2021-12-14 DIAGNOSIS — T781XXA Other adverse food reactions, not elsewhere classified, initial encounter: Secondary | ICD-10-CM | POA: Diagnosis not present

## 2022-01-17 ENCOUNTER — Other Ambulatory Visit: Payer: Self-pay

## 2022-01-17 ENCOUNTER — Ambulatory Visit
Admission: RE | Admit: 2022-01-17 | Discharge: 2022-01-17 | Disposition: A | Discharge status: Home / Self Care | Payer: 59 | Source: Ambulatory Visit | Attending: Nurse Practitioner | Admitting: Nurse Practitioner

## 2022-01-17 ENCOUNTER — Telehealth: Payer: Self-pay

## 2022-01-17 VITALS — BP 153/91 | HR 94 | Temp 98.7°F | Resp 18

## 2022-01-17 DIAGNOSIS — J069 Acute upper respiratory infection, unspecified: Secondary | ICD-10-CM

## 2022-01-17 DIAGNOSIS — J4531 Mild persistent asthma with (acute) exacerbation: Secondary | ICD-10-CM

## 2022-01-17 DIAGNOSIS — R21 Rash and other nonspecific skin eruption: Secondary | ICD-10-CM | POA: Diagnosis not present

## 2022-01-17 MED ORDER — DOXYCYCLINE HYCLATE 100 MG PO TABS: 100.0000 mg | ORAL_TABLET | Freq: Two times a day (BID) | ORAL | 0 refills | Status: DC

## 2022-01-17 MED ORDER — PREDNISONE 20 MG PO TABS: 40.0000 mg | ORAL_TABLET | Freq: Every day | ORAL | 0 refills | Status: AC

## 2022-01-17 MED ORDER — DEXAMETHASONE SODIUM PHOSPHATE 10 MG/ML IJ SOLN
10.0000 mg | Freq: Once | INTRAMUSCULAR | Status: AC
  Administered 2022-01-17: 10 mg via INTRAMUSCULAR

## 2022-01-17 MED ORDER — PSEUDOEPH-BROMPHEN-DM 30-2-10 MG/5ML PO SYRP
5.0000 mL | ORAL_SOLUTION | Freq: Four times a day (QID) | ORAL | 0 refills | Status: DC | PRN
Start: 2022-01-17 — End: 2023-04-20

## 2022-01-17 MED ORDER — PREDNISONE 20 MG PO TABS: 40.0000 mg | ORAL_TABLET | Freq: Every day | ORAL | 0 refills | Status: DC

## 2022-01-20 DIAGNOSIS — L0889 Other specified local infections of the skin and subcutaneous tissue: Secondary | ICD-10-CM | POA: Diagnosis not present

## 2022-01-25 DIAGNOSIS — J069 Acute upper respiratory infection, unspecified: Secondary | ICD-10-CM | POA: Diagnosis not present

## 2022-02-01 DIAGNOSIS — L0100 Impetigo, unspecified: Secondary | ICD-10-CM | POA: Diagnosis not present

## 2022-02-01 DIAGNOSIS — Z6824 Body mass index (BMI) 24.0-24.9, adult: Secondary | ICD-10-CM | POA: Diagnosis not present

## 2022-02-23 DIAGNOSIS — Z6825 Body mass index (BMI) 25.0-25.9, adult: Secondary | ICD-10-CM | POA: Diagnosis not present

## 2022-02-23 DIAGNOSIS — M79605 Pain in left leg: Secondary | ICD-10-CM | POA: Diagnosis not present

## 2022-03-04 DIAGNOSIS — L820 Inflamed seborrheic keratosis: Secondary | ICD-10-CM | POA: Diagnosis not present

## 2022-03-04 DIAGNOSIS — D485 Neoplasm of uncertain behavior of skin: Secondary | ICD-10-CM | POA: Diagnosis not present

## 2022-03-04 DIAGNOSIS — Z85828 Personal history of other malignant neoplasm of skin: Secondary | ICD-10-CM | POA: Diagnosis not present

## 2022-03-04 DIAGNOSIS — D2271 Melanocytic nevi of right lower limb, including hip: Secondary | ICD-10-CM | POA: Diagnosis not present

## 2022-03-04 DIAGNOSIS — D1801 Hemangioma of skin and subcutaneous tissue: Secondary | ICD-10-CM | POA: Diagnosis not present

## 2022-04-12 ENCOUNTER — Encounter: Payer: Self-pay | Admitting: *Deleted

## 2022-04-12 DIAGNOSIS — Z6824 Body mass index (BMI) 24.0-24.9, adult: Secondary | ICD-10-CM | POA: Diagnosis not present

## 2022-04-12 DIAGNOSIS — Z13220 Encounter for screening for lipoid disorders: Secondary | ICD-10-CM | POA: Diagnosis not present

## 2022-04-13 ENCOUNTER — Ambulatory Visit: Payer: 59 | Attending: Cardiology | Admitting: Cardiology

## 2022-04-13 ENCOUNTER — Encounter: Payer: Self-pay | Admitting: Cardiology

## 2022-04-13 VITALS — BP 130/90 | HR 75 | Ht 65.0 in | Wt 150.0 lb

## 2022-04-13 DIAGNOSIS — R002 Palpitations: Secondary | ICD-10-CM | POA: Diagnosis not present

## 2022-04-13 DIAGNOSIS — I10 Essential (primary) hypertension: Secondary | ICD-10-CM | POA: Diagnosis not present

## 2022-04-13 DIAGNOSIS — I471 Supraventricular tachycardia: Secondary | ICD-10-CM | POA: Diagnosis not present

## 2022-04-13 MED ORDER — DILTIAZEM HCL ER COATED BEADS 300 MG PO CP24: 300.0000 mg | ORAL_CAPSULE | Freq: Every day | ORAL | 2 refills | Status: DC

## 2022-04-13 NOTE — Progress Notes (Signed)
Cardiology Office Note  Date: 04/13/2022   ID: [name], DOB [birth year], MRN [id number]  PCP:  Sharilyn Sites, MD  Cardiologist:  Rozann Lesches, MD Electrophysiologist:  None   Chief Complaint  Patient presents with   Cardiac follow-up    History of Present Illness: [name] is a 61 y.o. female last seen in March.  She is here for a routine visit.  Describes two recent episodes of palpitations and chest discomfort, one in August, on the other actually last night.  The episode last night woke her up, lasted for about a minute.  She does not report any unexplained syncope, no exercise intolerance.  I reviewed her medications which are noted below.  We discussed increasing Cardizem CD to 300 mg daily.  I personally reviewed her ECG today which shows normal sinus rhythm with heart rate in the 70s.  Past Medical History:  Diagnosis Date   Asthma    Atypical nevus 10/26/2012   1.RIGHT ANT THIGH -MODERATE, 2.RIGHT UPPER ABDOMEN- MILD, 3.RIGHT LABIA -MILD, 4.RIGHT UPPER BACK -MILD   Chronic back pain    Essential hypertension    History of cardiac catheterization 2011   Reportedly normal coronary arteries - [facility]   History of kidney stones    History of mitral valve prolapse    History of skin cancer    Hypothyroidism    Urinary incontinence 08/12/2015    Past Surgical History:  Procedure Laterality Date   ABDOMINAL HYSTERECTOMY     BACK SURGERY     x3-ruptured disc   Bone spur Right    Foot   CARDIAC CATHETERIZATION  11/2009   Normal coronary arteries ([place])   CHOLECYSTECTOMY     COLONOSCOPY N/A 07/15/2014   Procedure: COLONOSCOPY;  Surgeon: Daneil Dolin, MD;  [facility];  Service: Endoscopy;  Laterality: N/A;   COLONOSCOPY WITH PROPOFOL N/A 10/19/2021   Procedure: COLONOSCOPY WITH PROPOFOL;  Surgeon: Daneil Dolin, MD;  [facility];  Service: Endoscopy;  Laterality: N/A;  9:00 / ASA 2   CYST REMOVAL HAND Right    KNEE  ARTHROSCOPY Left     Current Outpatient Medications  Medication Sig Dispense Refill   acetaminophen (TYLENOL) 500 MG tablet Take 1,000 mg by mouth every 6 (six) hours as needed for moderate pain.     albuterol (PROAIR HFA) 108 (90 Base) MCG/ACT inhaler Inhale 2 puffs into the lungs every 4 (four) hours as needed for wheezing or shortness of breath. 1 Inhaler 3   brompheniramine-pseudoephedrine-DM 30-2-10 MG/5ML syrup Take 5 mLs by mouth 4 (four) times daily as needed. 140 mL 0   cetirizine (ZYRTEC) 10 MG tablet Take 10 mg by mouth daily in the afternoon.     cyclobenzaprine (FLEXERIL) 10 MG tablet Take 10 mg by mouth daily as needed for muscle spasms.     diazepam (VALIUM) 2 MG tablet Take 2 mg by mouth as needed (anxiety when flying).     diltiazem (CARDIZEM CD) 300 MG 24 hr capsule Take 1 capsule (300 mg total) by mouth daily. 90 capsule 2   EPINEPHrine 0.3 mg/0.3 mL IJ SOAJ injection Inject 0.3 mLs (0.3 mg total) into the muscle once. (Patient taking differently: Inject 0.3 mg into the muscle as needed for anaphylaxis.) 2 Device 2   levothyroxine (SYNTHROID) 100 MCG tablet Take 100 mcg by mouth See admin instructions. Take 100 mcg daily except skip dose on Sundays     olmesartan (BENICAR) 20 MG  tablet Take 20 mg by mouth daily.     benzonatate (TESSALON) 100 MG capsule Take 100 mg by mouth as needed for cough. (Patient not taking: Reported on 04/13/2022)     doxycycline (VIBRA-TABS) 100 MG tablet Take 1 tablet (100 mg total) by mouth 2 (two) times daily. (Patient not taking: Reported on 04/13/2022) 20 tablet 0   Polyethyl Glycol-Propyl Glycol (SYSTANE OP) Place 1 drop into both eyes daily as needed (dry eyes). (Patient not taking: Reported on 04/13/2022)     rosuvastatin (CRESTOR) 5 MG tablet Take 5 mg by mouth at bedtime. (Patient not taking: Reported on 04/13/2022)     No current facility-administered medications for this visit.   Allergies:  Other, Peanut-containing drug products, Covid-19  (mrna) vaccine, Dilaudid [hydromorphone hcl], Morphine and related, Phenergan [promethazine hcl], and Suprep bowel prep kit [na sulfate-k sulfate-mg sulf]   ROS: No orthopnea or PND.  Physical Exam: VS:  BP (!) 130/90 (BP Location: Left Arm, Patient Position: Sitting, Cuff Size: Normal)   Pulse 75   Ht _0  (1.651 m)   Wt 150 lb (68 kg)   SpO2 98%   BMI 24.96 kg/m , BMI Body mass index is 24.96 kg/m.  Wt Readings from Last 3 Encounters:  04/13/22 150 lb (68 kg)  10/19/21 143 lb (64.9 kg)  10/08/21 145 lb 12.8 oz (66.1 kg)    General: Patient appears comfortable at rest. HEENT: Conjunctiva and lids normal. Neck: Supple, no elevated JVP or carotid bruits, no thyromegaly. Lungs: Clear to auscultation, nonlabored breathing at rest. Cardiac: Regular rate and rhythm, no S3 or significant systolic murmur, no pericardial rub. Extremities: No pitting edema.  ECG:  An ECG dated 10/08/2021 was personally reviewed today and demonstrated:  Sinus rhythm.  Recent Labwork: 07/30/2021: BUN 24; Creatinine, Ser 0.68; Hemoglobin 14.7; Platelets 200; Potassium 3.8; Sodium 141   Other Studies Reviewed Today:  Echocardiogram 09/19/2018:  1. The left ventricle has hyperdynamic systolic function, with an  ejection fraction of >65%. The cavity size was normal. Left ventricular  diastolic parameters were normal. No evidence of left ventricular regional  wall motion abnormalities.   2. The right ventricle has normal systolic function. The cavity was  normal. There is no increase in right ventricular wall thickness. Right  ventricular systolic pressure. Normal estimated RVSP with an estimated  pressure of 16.4 mmHg.   3. The mitral valve is normal in structure. No diagnostic evidence of  mitral valve prolapse.   4. The tricuspid valve is normal in structure.   5. The aortic valve is tricuspid.   6. The aortic root is normal in size and structure.   Cardiac monitor March 2020: 7-day event recorder  reviewed.  Sinus rhythm was present throughout.  Heart rate ranged from 44 bpm up to 136 bpm with average heart rate 73 bpm.  Rare PACs and PVCs were noted,each representing less than 1% of total beats.  Ectopic beats were isolated with the exception of a brief 5 beat burst of SVT and a few atrial couplets.  No obvious atrial fibrillation or atrial flutter.  No pauses.   Assessment and Plan:  1.  History of palpitations and brief SVT documented by prior cardiac monitoring in 2020.  She has had some interval palpitations as discussed above.  ECG is normal today.  We will try an increase in Cardizem CD to 300 mg daily.  I did ask her to let me know if symptoms continue to worsen as we will repeat  monitoring at that point.  2.  Essential hypertension, systolic is in the 164H today.  She continues on Benicar in addition to Cardizem CD.  Medication Adjustments/Labs and Tests Ordered: Current medicines are reviewed at length with the patient today.  Concerns regarding medicines are outlined above.   Tests Ordered: Orders Placed This Encounter  Procedures   EKG 12-Lead    Medication Changes: Meds ordered this encounter  Medications   diltiazem (CARDIZEM CD) 300 MG 24 hr capsule    Sig: Take 1 capsule (300 mg total) by mouth daily.    Dispense:  90 capsule    Refill:  2    04/13/2022 dose increase    Disposition:  Follow up  6 months.  Signed, Satira Sark, MD, [facility] 04/13/2022 1:26 PM    [facility] [place], [place], [place] [address] Phone: [phone]; Fax: [phone]

## 2022-04-13 NOTE — Patient Instructions (Addendum)
Medication Instructions:  Your physician has recommended you make the following change in your medication:  Increase diltiazem to 300 mg daily Continue other medications the same  Labwork: none  Testing/Procedures: none  Follow-Up: Your physician recommends that you schedule a follow-up appointment in: 6 months  Any Other Special Instructions Will Be Listed Below (If Applicable).  If you need a refill on your cardiac medications before your next appointment, please call your pharmacy.

## 2022-05-18 DIAGNOSIS — J329 Chronic sinusitis, unspecified: Secondary | ICD-10-CM | POA: Diagnosis not present

## 2022-06-01 DIAGNOSIS — B078 Other viral warts: Secondary | ICD-10-CM | POA: Diagnosis not present

## 2022-06-01 DIAGNOSIS — D2261 Melanocytic nevi of right upper limb, including shoulder: Secondary | ICD-10-CM | POA: Diagnosis not present

## 2022-06-01 DIAGNOSIS — L812 Freckles: Secondary | ICD-10-CM | POA: Diagnosis not present

## 2022-06-01 DIAGNOSIS — D1801 Hemangioma of skin and subcutaneous tissue: Secondary | ICD-10-CM | POA: Diagnosis not present

## 2022-06-01 DIAGNOSIS — D2272 Melanocytic nevi of left lower limb, including hip: Secondary | ICD-10-CM | POA: Diagnosis not present

## 2022-06-01 DIAGNOSIS — D2262 Melanocytic nevi of left upper limb, including shoulder: Secondary | ICD-10-CM | POA: Diagnosis not present

## 2022-06-01 DIAGNOSIS — D2271 Melanocytic nevi of right lower limb, including hip: Secondary | ICD-10-CM | POA: Diagnosis not present

## 2022-06-01 DIAGNOSIS — D485 Neoplasm of uncertain behavior of skin: Secondary | ICD-10-CM | POA: Diagnosis not present

## 2022-06-01 DIAGNOSIS — Z85828 Personal history of other malignant neoplasm of skin: Secondary | ICD-10-CM | POA: Diagnosis not present

## 2022-06-01 DIAGNOSIS — L430 Hypertrophic lichen planus: Secondary | ICD-10-CM | POA: Diagnosis not present

## 2022-06-01 DIAGNOSIS — L570 Actinic keratosis: Secondary | ICD-10-CM | POA: Diagnosis not present

## 2022-06-01 DIAGNOSIS — L821 Other seborrheic keratosis: Secondary | ICD-10-CM | POA: Diagnosis not present

## 2022-06-01 DIAGNOSIS — D225 Melanocytic nevi of trunk: Secondary | ICD-10-CM | POA: Diagnosis not present

## 2022-06-12 ENCOUNTER — Emergency Department (HOSPITAL_COMMUNITY)
Admission: EM | Admit: 2022-06-12 | Discharge: 2022-06-12 | Disposition: A | Discharge status: Home / Self Care | Payer: 59 | Attending: Emergency Medicine | Admitting: Emergency Medicine

## 2022-06-12 ENCOUNTER — Other Ambulatory Visit: Payer: Self-pay

## 2022-06-12 ENCOUNTER — Emergency Department (HOSPITAL_COMMUNITY): Payer: 59

## 2022-06-12 DIAGNOSIS — Z981 Arthrodesis status: Secondary | ICD-10-CM | POA: Diagnosis not present

## 2022-06-12 DIAGNOSIS — N132 Hydronephrosis with renal and ureteral calculous obstruction: Secondary | ICD-10-CM | POA: Insufficient documentation

## 2022-06-12 DIAGNOSIS — N1330 Unspecified hydronephrosis: Secondary | ICD-10-CM | POA: Diagnosis not present

## 2022-06-12 DIAGNOSIS — Z79899 Other long term (current) drug therapy: Secondary | ICD-10-CM | POA: Insufficient documentation

## 2022-06-12 DIAGNOSIS — I10 Essential (primary) hypertension: Secondary | ICD-10-CM | POA: Diagnosis not present

## 2022-06-12 DIAGNOSIS — E039 Hypothyroidism, unspecified: Secondary | ICD-10-CM | POA: Insufficient documentation

## 2022-06-12 DIAGNOSIS — Z91010 Allergy to peanuts: Secondary | ICD-10-CM | POA: Insufficient documentation

## 2022-06-12 DIAGNOSIS — N209 Urinary calculus, unspecified: Secondary | ICD-10-CM | POA: Diagnosis not present

## 2022-06-12 DIAGNOSIS — N200 Calculus of kidney: Secondary | ICD-10-CM

## 2022-06-12 DIAGNOSIS — M545 Low back pain, unspecified: Secondary | ICD-10-CM | POA: Diagnosis not present

## 2022-06-12 DIAGNOSIS — R1032 Left lower quadrant pain: Secondary | ICD-10-CM | POA: Diagnosis not present

## 2022-06-12 LAB — CBC WITH DIFFERENTIAL/PLATELET
Abs Immature Granulocytes: 0.02 10*3/uL (ref 0.00–0.07)
Basophils Absolute: 0 10*3/uL (ref 0.0–0.1)
Basophils Relative: 0 %
Eosinophils Absolute: 0 10*3/uL (ref 0.0–0.5)
Eosinophils Relative: 0 %
HCT: 39.4 % (ref 36.0–46.0)
Hemoglobin: 13.5 g/dL (ref 12.0–15.0)
Immature Granulocytes: 0 %
Lymphocytes Relative: 10 %
Lymphs Abs: 0.7 10*3/uL (ref 0.7–4.0)
MCH: 30.8 pg (ref 26.0–34.0)
MCHC: 34.3 g/dL (ref 30.0–36.0)
MCV: 89.7 fL (ref 80.0–100.0)
Monocytes Absolute: 0.6 10*3/uL (ref 0.1–1.0)
Monocytes Relative: 9 %
Neutro Abs: 5.7 10*3/uL (ref 1.7–7.7)
Neutrophils Relative %: 81 %
Platelets: 185 10*3/uL (ref 150–400)
RBC: 4.39 MIL/uL (ref 3.87–5.11)
RDW: 11.9 % (ref 11.5–15.5)
WBC: 7.1 10*3/uL (ref 4.0–10.5)
nRBC: 0 % (ref 0.0–0.2)

## 2022-06-12 LAB — COMPREHENSIVE METABOLIC PANEL
ALT: 38 U/L (ref 0–44)
AST: 39 U/L (ref 15–41)
Albumin: 4.2 g/dL (ref 3.5–5.0)
Alkaline Phosphatase: 92 U/L (ref 38–126)
Anion gap: 9 (ref 5–15)
BUN: 20 mg/dL (ref 6–20)
CO2: 25 mmol/L (ref 22–32)
Calcium: 9 mg/dL (ref 8.9–10.3)
Chloride: 103 mmol/L (ref 98–111)
Creatinine, Ser: 0.87 mg/dL (ref 0.44–1.00)
GFR, Estimated: >60 mL/min (ref >60)
Glucose, Bld: 139 mg/dL — ABNORMAL HIGH (ref 70–99)
Potassium: 3.5 mmol/L (ref 3.5–5.1)
Sodium: 137 mmol/L (ref 135–145)
Total Bilirubin: 0.5 mg/dL (ref 0.3–1.2)
Total Protein: 6.8 g/dL (ref 6.5–8.1)

## 2022-06-12 LAB — URINALYSIS, ROUTINE W REFLEX MICROSCOPIC
Bilirubin Urine: NEGATIVE
Glucose, UA: NEGATIVE mg/dL
Ketones, ur: NEGATIVE mg/dL
Nitrite: NEGATIVE
Protein, ur: NEGATIVE mg/dL
Specific Gravity, Urine: 1.017 (ref 1.005–1.030)
pH: 8 (ref 5.0–8.0)

## 2022-06-12 LAB — LIPASE, BLOOD: Lipase: 23 U/L (ref 11–51)

## 2022-06-12 MED ORDER — TAMSULOSIN HCL 0.4 MG PO CAPS: 0.4000 mg | ORAL_CAPSULE | Freq: Every day | ORAL | 0 refills | Status: DC

## 2022-06-12 MED ORDER — CEPHALEXIN 500 MG PO CAPS: 500.0000 mg | ORAL_CAPSULE | Freq: Four times a day (QID) | ORAL | 0 refills | Status: DC

## 2022-06-12 MED ORDER — HYDROMORPHONE HCL 1 MG/ML IJ SOLN
1.0000 mg | Freq: Once | INTRAMUSCULAR | Status: DC
  Filled 2022-06-12 (×2): qty 1

## 2022-06-12 MED ORDER — ONDANSETRON 8 MG PO TBDP: 8.0000 mg | ORAL_TABLET | Freq: Three times a day (TID) | ORAL | 0 refills | Status: AC | PRN

## 2022-06-12 MED ORDER — KETOROLAC TROMETHAMINE 10 MG PO TABS: 10.0000 mg | ORAL_TABLET | Freq: Four times a day (QID) | ORAL | 0 refills | Status: DC | PRN

## 2022-06-12 MED ORDER — ONDANSETRON HCL 4 MG/2ML IJ SOLN
4.0000 mg | Freq: Once | INTRAMUSCULAR | Status: AC
  Administered 2022-06-12: 4 mg via INTRAVENOUS
  Filled 2022-06-12: qty 2

## 2022-06-12 MED ORDER — KETOROLAC TROMETHAMINE 30 MG/ML IJ SOLN
15.0000 mg | Freq: Once | INTRAMUSCULAR | Status: AC
  Administered 2022-06-12: 15 mg via INTRAVENOUS
  Filled 2022-06-12: qty 1

## 2022-06-12 MED ORDER — LACTATED RINGERS IV BOLUS
1000.0000 mL | Freq: Once | INTRAVENOUS | Status: AC
Start: 2022-06-12 — End: 2022-06-12
  Administered 2022-06-12: 1000 mL via INTRAVENOUS

## 2022-06-12 NOTE — ED Notes (Signed)
Pt d/c home with visitor per MD order. Discharge summary reviewed, pt verbalizes understanding. Ambulatory off unit. No s/s of acute distress noted at discharge.

## 2022-06-12 NOTE — ED Triage Notes (Signed)
Pt c/o left lower abd pain, left lower back , n/v since Thursday evening. Pt has a hx of kidney stones.

## 2022-06-12 NOTE — ED Provider Notes (Signed)
[facility] EMERGENCY DEPARTMENT Provider Note   CSN: [id number] Arrival date & time: 06/12/22  4098     History  Chief Complaint  Patient presents with   Abdominal Pain   Back Pain   Nausea    [name] is a 61 y.o. female.  HPI   Patient with medical history of nephrolithiasis, hypertension, SVT, hypothyroid, chronic back pain presents to the emergency department due to left sided flank pain and left lower quadrant abdominal pain.  Started 4 days ago, it was intermittent.  Unable to identify any provoking or alleviating factors.  States it feels like previous kidney stone, characterizes sharp.  Yesterday it became constant, more severe and associated with nausea and multiple episodes of emesis.  Denies any change in bowel habits, dysuria, hematuria, chest pain, shortness of breath, increased urinary frequency, rectal bleeding, dark and tarry stool, hematemesis, vaginal discharge.  Surgical history is notable for abdominal hysterectomy, cholecystectomy.  Home Medications Prior to Admission medications   Medication Sig Start Date End Date Taking? Authorizing Provider  cephALEXin (KEFLEX) 500 MG capsule Take 1 capsule (500 mg total) by mouth 4 (four) times daily. 06/12/22  Yes Sherrill Raring, PA-C  ketorolac (TORADOL) 10 MG tablet Take 1 tablet (10 mg total) by mouth every 6 (six) hours as needed. 06/12/22  Yes Sherrill Raring, PA-C  ondansetron (ZOFRAN-ODT) 8 MG disintegrating tablet Take 1 tablet (8 mg total) by mouth every 8 (eight) hours as needed for nausea or vomiting. 06/12/22  Yes Sherrill Raring, PA-C  tamsulosin (FLOMAX) 0.4 MG CAPS capsule Take 1 capsule (0.4 mg total) by mouth daily after supper. 06/12/22  Yes Sherrill Raring, PA-C  acetaminophen (TYLENOL) 500 MG tablet Take 1,000 mg by mouth every 6 (six) hours as needed for moderate pain.    [provider]  albuterol (PROAIR HFA) 108 (90 Base) MCG/ACT inhaler Inhale 2 puffs into the lungs every 4 (four) hours as needed  for wheezing or shortness of breath. 12/15/15   Bardelas, Jens Som, MD  benzonatate (TESSALON) 100 MG capsule Take 100 mg by mouth as needed for cough. Patient not taking: Reported on 04/13/2022    [provider]  brompheniramine-pseudoephedrine-DM 30-2-10 MG/5ML syrup Take 5 mLs by mouth 4 (four) times daily as needed. 01/17/22   Leath-Warren, Alda Lea, NP  cetirizine (ZYRTEC) 10 MG tablet Take 10 mg by mouth daily in the afternoon.    [provider]  cyclobenzaprine (FLEXERIL) 10 MG tablet Take 10 mg by mouth daily as needed for muscle spasms. 05/28/21   [provider]  diazepam (VALIUM) 2 MG tablet Take 2 mg by mouth as needed (anxiety when flying).    [provider]  diltiazem (CARDIZEM CD) 300 MG 24 hr capsule Take 1 capsule (300 mg total) by mouth daily. 04/13/22   Satira Sark, MD  doxycycline (VIBRA-TABS) 100 MG tablet Take 1 tablet (100 mg total) by mouth 2 (two) times daily. Patient not taking: Reported on 04/13/2022 01/17/22   Leath-Warren, Alda Lea, NP  EPINEPHrine 0.3 mg/0.3 mL IJ SOAJ injection Inject 0.3 mLs (0.3 mg total) into the muscle once. Patient taking differently: Inject 0.3 mg into the muscle as needed for anaphylaxis. 12/15/15   Charlies Silvers, MD  levothyroxine (SYNTHROID) 100 MCG tablet Take 100 mcg by mouth See admin instructions. Take 100 mcg daily except skip dose on Sundays    [provider]  olmesartan (BENICAR) 20 MG tablet Take 20 mg by mouth daily. 04/29/21  [provider]  Polyethyl Glycol-Propyl Glycol (SYSTANE OP) Place 1 drop into both eyes daily as needed (dry eyes). Patient not taking: Reported on 04/13/2022    [provider]  rosuvastatin (CRESTOR) 5 MG tablet Take 5 mg by mouth at bedtime. Patient not taking: Reported on 04/13/2022 05/14/21   [provider]      Allergies    Other, Peanut-containing drug products, Covid-19 (mrna) vaccine, Dilaudid [hydromorphone hcl],  Morphine and related, Phenergan [promethazine hcl], and Suprep bowel prep kit [na sulfate-k sulfate-mg sulf]    Review of Systems   Review of Systems  Physical Exam Updated Vital Signs BP (!) 153/79   Pulse 66   Temp (!) 97.5 F (36.4 C) (Oral)   Resp 17   Ht _0  (1.676 m)   Wt 69.9 kg   SpO2 99%   BMI 24.86 kg/m  Physical Exam Vitals and nursing note reviewed. Exam conducted with a chaperone present.  Constitutional:      Appearance: Normal appearance.  HENT:     Head: Normocephalic and atraumatic.  Eyes:     General: No scleral icterus.       Right eye: No discharge.        Left eye: No discharge.     Extraocular Movements: Extraocular movements intact.     Pupils: Pupils are equal, round, and reactive to light.  Cardiovascular:     Rate and Rhythm: Normal rate and regular rhythm.     Pulses: Normal pulses.     Heart sounds: Normal heart sounds. No murmur heard.    No friction rub. No gallop.  Pulmonary:     Effort: Pulmonary effort is normal. No respiratory distress.     Breath sounds: Normal breath sounds.  Abdominal:     General: Abdomen is flat. A surgical scar is present. Bowel sounds are normal. There is no distension.     Palpations: Abdomen is soft.     Tenderness: There is abdominal tenderness in the left lower quadrant. There is left CVA tenderness. There is no guarding or rebound.  Skin:    General: Skin is warm and dry.     Coloration: Skin is not jaundiced.  Neurological:     Mental Status: She is alert. Mental status is at baseline.     Coordination: Coordination normal.     ED Results / Procedures / Treatments   Labs (all labs ordered are listed, but only abnormal results are displayed) Labs Reviewed  URINALYSIS, ROUTINE W REFLEX MICROSCOPIC - Abnormal; Notable for the following components:      Result Value   APPearance CLOUDY (*)    Hgb urine dipstick SMALL (*)    Leukocytes,Ua TRACE (*)    Bacteria, UA RARE (*)    All other components  within normal limits  COMPREHENSIVE METABOLIC PANEL - Abnormal; Notable for the following components:   Glucose, Bld 139 (*)    All other components within normal limits  URINE CULTURE  CBC WITH DIFFERENTIAL/PLATELET  LIPASE, BLOOD    EKG None  Radiology CT Renal Stone Study  Result Date: 06/12/2022 CLINICAL DATA:  Left lower abdominal and back pain associated with nausea and vomiting. History of kidney stones. EXAM: CT ABDOMEN AND PELVIS WITHOUT CONTRAST TECHNIQUE: Multidetector CT imaging of the abdomen and pelvis was performed following the standard protocol without IV contrast. RADIATION DOSE REDUCTION: This exam was performed according to the departmental dose-optimization program which includes automated exposure control, adjustment of the mA and/or kV  according to patient size and/or use of iterative reconstruction technique. COMPARISON:  CT abdomen and pelvis dated 04/01/2016, CT lumbar spine dated 10/25/2018 FINDINGS: Lower chest: No focal consolidation or pulmonary nodule in the lung bases. Subsegmental atelectasis involving the lingula and medial right lower lobe. No pleural effusion or pneumothorax demonstrated. Partially imaged heart size is normal. Hepatobiliary: No focal hepatic lesions. No intra or extrahepatic biliary ductal dilation. Cholecystectomy. Pancreas: No focal lesions or main ductal dilation. Spleen: Normal in size without focal abnormality. Adrenals/Urinary Tract: No adrenal nodules. 3 mm obstructing stone at the left ureterovesical junction with mild upstream hydroureteronephrosis. Mild left perinephric stranding. No suspicious renal masses by noncontrast technique. Ill-defined hyperattenuation in the lower pole right kidney, where there was a cyst previously on 04/01/2016, likely hemorrhage/proteinaceous debris. No focal bladder wall thickening. Stomach/Bowel: Normal appearance of the stomach. No evidence of bowel wall thickening, distention, or inflammatory changes.  Normal appendix. Vascular/Lymphatic: No significant vascular findings are present. No enlarged abdominal or pelvic lymph nodes. Reproductive: No adnexal masses. Other: No free fluid, fluid collection, or free air. Musculoskeletal: No acute or abnormal lytic or blastic osseous lesions. Postsurgical changes from L4-5 fusion with similar appearance of the L5 right pedicle screw. Hardware appears intact. Partially imaged bilateral breast implants. IMPRESSION: Obstructing 3 mm stone at the left ureterovesical junction with mild upstream hydroureteronephrosis. Electronically Signed   By: Darrin Nipper M.D.   On: 06/12/2022 10:40    Procedures Procedures    Medications Ordered in ED Medications  ondansetron (ZOFRAN) injection 4 mg (4 mg Intravenous Given 06/12/22 0939)  lactated ringers bolus 1,000 mL (0 mLs Intravenous Stopped 06/12/22 1102)  ketorolac (TORADOL) 30 MG/ML injection 15 mg (15 mg Intravenous Given 06/12/22 0945)    ED Course/ Medical Decision Making/ A&P                           Medical Decision Making Amount and/or Complexity of Data Reviewed Labs: ordered. Radiology: ordered.  Risk Prescription drug management.   This is a 61 year old female presenting today with left flank and left lower quadrant abdominal pain.  This is an acute problem, differential includes but not limited to nephrolithiasis, diverticulitis, pyelonephritis, UTI, septic stone.  I reviewed external medical records including patient's abdominal surgical history including a abdominal hysterectomy and cholecystectomy.  I also reviewed patient's home medication list.  Patient's husband is at bedside providing independent history.  I ordered, viewed and interpreted laboratory work-up.  Per my interpretation: CBC without leukocytosis or anemia. CMP no transaminitis, gross electrolyte abnormality or AKI Lipase within normal limits UA is notable for trace leukocyturia and trace hematuria. Urine culture  pending.  EKG shows sinus rhythm.  Patient is on cardiac monitoring in sinus rhythm per my interpretation.  I ordered and reviewed CT renal study.  Notable for 3 mm stone in the ureteral vesicular junction with mild hydronephrosis.  I ordered medication for the patient including 1 L lactated Ringer bolus, 4 mg Zofran, Toradol 15 mg.  I reevaluated the patient was feeling improved.  She was initially offered nonnarcotic medicine declined stating she does not like to be on opiates.  I considered admission but given patient has a 3 mm stone, no systemic signs of infection, no SIRS criteria, doubt septic stone and patient clinically is not septic, I think outpatient management appropriate. Will cover with antibiotics given trace leukocytes in the urine and send culture.  Strict return precautions were discussed with the patient  and her husband who verbalized understanding with the plan.  They have a urologist to follow-up with me also provided them with information for Dr. Alyson Ingles and resulting secondary referral as its been sometime since her previous stone.        Final Clinical Impression(s) / ED Diagnoses Final diagnoses:  Nephrolithiasis    Rx / DC Orders ED Discharge Orders          Ordered    tamsulosin (FLOMAX) 0.4 MG CAPS capsule  Daily after supper        06/12/22 1103    ketorolac (TORADOL) 10 MG tablet  Every 6 hours PRN        06/12/22 1103    cephALEXin (KEFLEX) 500 MG capsule  4 times daily        06/12/22 1103    ondansetron (ZOFRAN-ODT) 8 MG disintegrating tablet  Every 8 hours PRN        06/12/22 1103              Sherrill Raring, PA-C 06/12/22 1105    Milton Ferguson, MD 06/15/22 1235

## 2022-06-12 NOTE — ED Notes (Signed)
Family at bedside. 

## 2022-06-12 NOTE — Discharge Instructions (Signed)
You are seen today in the emergency department due to kidney stone.  You have a 3 mm stone in your left ureter that should pass on its own given its size.  Take the Flomax once a day with dinner, this helps expel the stone.  Take the Toradol every 6 hours as needed for pain.  If you experience nausea take the Zofran every 8 hours as needed.  This dissolves under your tongue.  Your urine shows trace signs of infection should also need to take the Keflex 1000 mg twice daily for the next 5 days.:  Schedule appointment with urologist.  Return to the emergency department if you are having uncontrollable vomiting, severe pain, fevers or new or worsening symptoms.

## 2022-06-13 LAB — URINE CULTURE: Culture: NO GROWTH

## 2022-06-14 ENCOUNTER — Ambulatory Visit (INDEPENDENT_AMBULATORY_CARE_PROVIDER_SITE_OTHER): Payer: 59 | Admitting: Urology

## 2022-06-14 ENCOUNTER — Encounter: Payer: Self-pay | Admitting: Urology

## 2022-06-14 VITALS — BP 132/80 | HR 79

## 2022-06-14 DIAGNOSIS — N281 Cyst of kidney, acquired: Secondary | ICD-10-CM | POA: Diagnosis not present

## 2022-06-14 DIAGNOSIS — N201 Calculus of ureter: Secondary | ICD-10-CM | POA: Diagnosis not present

## 2022-06-14 LAB — URINALYSIS, ROUTINE W REFLEX MICROSCOPIC
Bilirubin, UA: NEGATIVE
Glucose, UA: NEGATIVE
Ketones, UA: NEGATIVE
Nitrite, UA: NEGATIVE
Protein,UA: NEGATIVE
Specific Gravity, UA: 1.01 (ref 1.005–1.030)
Urobilinogen, Ur: 0.2 mg/dL (ref 0.2–1.0)
pH, UA: 7 (ref 5.0–7.5)

## 2022-06-14 LAB — MICROSCOPIC EXAMINATION: Bacteria, UA: NONE SEEN

## 2022-06-14 NOTE — Progress Notes (Unsigned)
06/14/2022 11:02 AM   [name] 03-Apr-1961 [id number]  Referring provider: Sharilyn Sites, MD [address] [place],  [place] [address]  No chief complaint on file.   HPI:  New pt -   1) left ureteral stone - she developed left flank pain. A CT 06/12/2022 revealed a 3 mm obstructing stone at the left ureterovesical junction with mild upstream hydroureteronephrosis. It may be visible on the scout. No renal stones. WBC was 7.1 and Cr 0.87. She had rare bacteria and some wbcs on her UA but no dysuria. Urine cx was negative. She asked about the right renal cyst and we discussed it.   She has not see a stone pass and had pain this AM in the left flank and left lower quadrant. Pain med helped but ibuprofen worked well a few days ago. She is taking tamsulosin but it made if feel "weird in her chest and back".   She has two other stone episodes she passed. No surgery.   She owns [employer].    PMH: Past Medical History:  Diagnosis Date   Asthma    Atypical nevus 10/26/2012   1.RIGHT ANT THIGH -MODERATE, 2.RIGHT UPPER ABDOMEN- MILD, 3.RIGHT LABIA -MILD, 4.RIGHT UPPER BACK -MILD   Chronic back pain    Essential hypertension    History of cardiac catheterization 2011   Reportedly normal coronary arteries - [facility]   History of kidney stones    History of mitral valve prolapse    History of skin cancer    Hypothyroidism    Urinary incontinence 08/12/2015    Surgical History: Past Surgical History:  Procedure Laterality Date   ABDOMINAL HYSTERECTOMY     BACK SURGERY     x3-ruptured disc   Bone spur Right    Foot   CARDIAC CATHETERIZATION  11/2009   Normal coronary arteries ([place])   CHOLECYSTECTOMY     COLONOSCOPY N/A 07/15/2014   Procedure: COLONOSCOPY;  Surgeon: Daneil Dolin, MD;  Location: [facility];  Service: Endoscopy;  Laterality: N/A;   COLONOSCOPY WITH PROPOFOL N/A 10/19/2021   Procedure: COLONOSCOPY WITH PROPOFOL;  Surgeon: Daneil Dolin, MD;   Location: [facility];  Service: Endoscopy;  Laterality: N/A;  9:00 / ASA 2   CYST REMOVAL HAND Right    KNEE ARTHROSCOPY Left     Home Medications:  Allergies as of 06/14/2022       Reactions   Other Anaphylaxis   Tree nuts   Peanut-containing Drug Products Anaphylaxis, Hives, Shortness Of Breath   Covid-19 (mrna) Vaccine    A-FIB   Dilaudid [hydromorphone Hcl] Nausea And Vomiting       Morphine And Related Nausea And Vomiting   Phenergan [promethazine Hcl] Nausea And Vomiting   Suprep Bowel Prep Kit [na Sulfate-k Sulfate-mg Sulf] Itching, Rash        Medication List        Accurate as of June 14, 2022 11:02 AM. If you have any questions, ask your nurse or doctor.          acetaminophen 500 MG tablet Commonly known as: TYLENOL Take 1,000 mg by mouth every 6 (six) hours as needed for moderate pain.   albuterol 108 (90 Base) MCG/ACT inhaler Commonly known as: ProAir HFA Inhale 2 puffs into the lungs every 4 (four) hours as needed for wheezing or shortness of breath.   benzonatate 100 MG capsule Commonly known as: TESSALON Take 100 mg by mouth as needed for cough.  brompheniramine-pseudoephedrine-DM 30-2-10 MG/5ML syrup Take 5 mLs by mouth 4 (four) times daily as needed.   cephALEXin 500 MG capsule Commonly known as: KEFLEX Take 1 capsule (500 mg total) by mouth 4 (four) times daily.   cetirizine 10 MG tablet Commonly known as: ZYRTEC Take 10 mg by mouth daily in the afternoon.   cyclobenzaprine 10 MG tablet Commonly known as: FLEXERIL Take 10 mg by mouth daily as needed for muscle spasms.   diazepam 2 MG tablet Commonly known as: VALIUM Take 2 mg by mouth as needed (anxiety when flying).   diltiazem 300 MG 24 hr capsule Commonly known as: Cardizem CD Take 1 capsule (300 mg total) by mouth daily.   doxycycline 100 MG tablet Commonly known as: VIBRA-TABS Take 1 tablet (100 mg total) by mouth 2 (two) times daily.   EPINEPHrine 0.3 mg/0.3 mL  Soaj injection Commonly known as: EPI-PEN Inject 0.3 mLs (0.3 mg total) into the muscle once. What changed:  when to take this reasons to take this   ketorolac 10 MG tablet Commonly known as: TORADOL Take 1 tablet (10 mg total) by mouth every 6 (six) hours as needed.   levothyroxine 100 MCG tablet Commonly known as: SYNTHROID Take 100 mcg by mouth See admin instructions. Take 100 mcg daily except skip dose on Sundays   olmesartan 20 MG tablet Commonly known as: BENICAR Take 20 mg by mouth daily.   ondansetron 8 MG disintegrating tablet Commonly known as: ZOFRAN-ODT Take 1 tablet (8 mg total) by mouth every 8 (eight) hours as needed for nausea or vomiting.   rosuvastatin 5 MG tablet Commonly known as: CRESTOR Take 5 mg by mouth at bedtime.   SYSTANE OP Place 1 drop into both eyes daily as needed (dry eyes).   tamsulosin 0.4 MG Caps capsule Commonly known as: FLOMAX Take 1 capsule (0.4 mg total) by mouth daily after supper.        Allergies:  Allergies  Allergen Reactions   Other Anaphylaxis    Tree nuts   Peanut-Containing Drug Products Anaphylaxis, Hives and Shortness Of Breath   Covid-19 (Mrna) Vaccine     A-FIB   Dilaudid [Hydromorphone Hcl] Nausea And Vomiting        Morphine And Related Nausea And Vomiting   Phenergan [Promethazine Hcl] Nausea And Vomiting   Suprep Bowel Prep Kit [Na Sulfate-K Sulfate-Mg Sulf] Itching and Rash    Family History: Family History  Problem Relation Age of Onset   COPD Mother    Cancer Mother        skin   Hernia Mother    Fibromyalgia Mother    Hypertension Mother    Asthma Mother    Other Father        open heart surgery   Hypertension Father    Hypertension Brother    Interstitial cystitis Daughter    Allergic rhinitis Daughter    Asthma Daughter    Cancer Maternal Grandmother        cervical   Heart attack Maternal Grandmother        died at age 61   Cancer Maternal Grandfather        colon   Cancer  Paternal Grandmother        colon, cervical,melonoma   Other Paternal Grandmother        open heart surgery   Arthritis Paternal Grandfather    Leukemia Paternal Grandfather     Social History:  reports that she has never smoked. She has  never been exposed to tobacco smoke. She has never used smokeless tobacco. She reports that she does not drink alcohol and does not use drugs.   Physical Exam: BP 132/80   Pulse 79   Constitutional:  Alert and oriented, No acute distress. HEENT: [place] AT, moist mucus membranes.  Trachea midline, no masses. Cardiovascular: No clubbing, cyanosis, or edema. Respiratory: Normal respiratory effort, no increased work of breathing. GI: Abdomen is soft, nontender, nondistended, no abdominal masses GU: No CVA tenderness Skin: No rashes, bruises or suspicious lesions. Neurologic: Grossly intact, no focal deficits, moving all 4 extremities. Psychiatric: Normal mood and affect.  Laboratory Data: Lab Results  Component Value Date   WBC 7.1 06/12/2022   HGB 13.5 06/12/2022   HCT 39.4 06/12/2022   MCV 89.7 06/12/2022   PLT 185 06/12/2022    Lab Results  Component Value Date   CREATININE 0.87 06/12/2022      Urinalysis    Component Value Date/Time   COLORURINE YELLOW 06/12/2022 0926   APPEARANCEUR CLOUDY (A) 06/12/2022 0926   APPEARANCEUR Clear 08/30/2016 1330   LABSPEC 1.017 06/12/2022 0926   PHURINE 8.0 06/12/2022 0926   GLUCOSEU NEGATIVE 06/12/2022 0926   HGBUR SMALL (A) 06/12/2022 0926   BILIRUBINUR NEGATIVE 06/12/2022 0926   BILIRUBINUR Negative 08/30/2016 1330   KETONESUR NEGATIVE 06/12/2022 0926   PROTEINUR NEGATIVE 06/12/2022 0926   UROBILINOGEN 0.2 06/20/2012 2042   NITRITE NEGATIVE 06/12/2022 0926   LEUKOCYTESUR TRACE (A) 06/12/2022 0926    Lab Results  Component Value Date   LABMICR Comment 08/30/2016   BACTERIA RARE (A) 06/12/2022    Pertinent Imaging:  CT Renal Stone Study - I reviewed images.   Narrative CLINICAL  DATA:  Left lower abdominal and back pain associated with nausea and vomiting. History of kidney stones.  EXAM: CT ABDOMEN AND PELVIS WITHOUT CONTRAST  TECHNIQUE: Multidetector CT imaging of the abdomen and pelvis was performed following the standard protocol without IV contrast.  RADIATION DOSE REDUCTION: This exam was performed according to the departmental dose-optimization program which includes automated exposure control, adjustment of the mA and/or kV according to patient size and/or use of iterative reconstruction technique.  COMPARISON:  CT abdomen and pelvis dated 04/01/2016, CT lumbar spine dated 10/25/2018  FINDINGS: Lower chest: No focal consolidation or pulmonary nodule in the lung bases. Subsegmental atelectasis involving the lingula and medial right lower lobe. No pleural effusion or pneumothorax demonstrated. Partially imaged heart size is normal.  Hepatobiliary: No focal hepatic lesions. No intra or extrahepatic biliary ductal dilation. Cholecystectomy.  Pancreas: No focal lesions or main ductal dilation.  Spleen: Normal in size without focal abnormality.  Adrenals/Urinary Tract: No adrenal nodules. 3 mm obstructing stone at the left ureterovesical junction with mild upstream hydroureteronephrosis. Mild left perinephric stranding. No suspicious renal masses by noncontrast technique. Ill-defined hyperattenuation in the lower pole right kidney, where there was a cyst previously on 04/01/2016, likely hemorrhage/proteinaceous debris. No focal bladder wall thickening.  Stomach/Bowel: Normal appearance of the stomach. No evidence of bowel wall thickening, distention, or inflammatory changes. Normal appendix.  Vascular/Lymphatic: No significant vascular findings are present. No enlarged abdominal or pelvic lymph nodes.  Reproductive: No adnexal masses.  Other: No free fluid, fluid collection, or free air.  Musculoskeletal: No acute or abnormal lytic or  blastic osseous lesions. Postsurgical changes from L4-5 fusion with similar appearance of the L5 right pedicle screw. Hardware appears intact. Partially imaged bilateral breast implants.  IMPRESSION: Obstructing 3 mm stone at the left ureterovesical junction  with mild upstream hydroureteronephrosis.   Electronically Signed By: Darrin Nipper M.D. On: 06/12/2022 10:40   Assessment & Plan:    Left ureteral stone - we discussed nature r/b of continued stone passage, off label use of alpha blockers, ureteroscopy and ESWL. All questions answered. She will continue stone passage. She should alternate NSAIDs with pain meds. Cont tams. UA today is clear.   Right renal cyst - discussed stability over the years and typical benign nature.     No follow-ups on file.  Festus Aloe, MD  [facility]  [address] [place], [place] [address] [phone]

## 2022-06-28 ENCOUNTER — Ambulatory Visit: Payer: 59 | Admitting: Urology

## 2022-07-21 DIAGNOSIS — M25572 Pain in left ankle and joints of left foot: Secondary | ICD-10-CM | POA: Diagnosis not present

## 2022-08-05 DIAGNOSIS — M25572 Pain in left ankle and joints of left foot: Secondary | ICD-10-CM | POA: Diagnosis not present

## 2022-08-09 DIAGNOSIS — S93492D Sprain of other ligament of left ankle, subsequent encounter: Secondary | ICD-10-CM | POA: Diagnosis not present

## 2022-08-10 DIAGNOSIS — D2362 Other benign neoplasm of skin of left upper limb, including shoulder: Secondary | ICD-10-CM | POA: Diagnosis not present

## 2022-08-10 DIAGNOSIS — D2262 Melanocytic nevi of left upper limb, including shoulder: Secondary | ICD-10-CM | POA: Diagnosis not present

## 2022-08-10 DIAGNOSIS — L820 Inflamed seborrheic keratosis: Secondary | ICD-10-CM | POA: Diagnosis not present

## 2022-08-10 DIAGNOSIS — D485 Neoplasm of uncertain behavior of skin: Secondary | ICD-10-CM | POA: Diagnosis not present

## 2022-08-10 DIAGNOSIS — D1801 Hemangioma of skin and subcutaneous tissue: Secondary | ICD-10-CM | POA: Diagnosis not present

## 2022-08-17 DIAGNOSIS — E663 Overweight: Secondary | ICD-10-CM | POA: Diagnosis not present

## 2022-08-17 DIAGNOSIS — Z23 Encounter for immunization: Secondary | ICD-10-CM | POA: Diagnosis not present

## 2022-08-17 DIAGNOSIS — E538 Deficiency of other specified B group vitamins: Secondary | ICD-10-CM | POA: Diagnosis not present

## 2022-08-17 DIAGNOSIS — Z6825 Body mass index (BMI) 25.0-25.9, adult: Secondary | ICD-10-CM | POA: Diagnosis not present

## 2022-08-26 DIAGNOSIS — S93492D Sprain of other ligament of left ankle, subsequent encounter: Secondary | ICD-10-CM | POA: Diagnosis not present

## 2022-08-30 DIAGNOSIS — J020 Streptococcal pharyngitis: Secondary | ICD-10-CM | POA: Diagnosis not present

## 2022-09-03 ENCOUNTER — Encounter: Payer: Self-pay | Admitting: Emergency Medicine

## 2022-09-03 ENCOUNTER — Ambulatory Visit
Admission: EM | Admit: 2022-09-03 | Discharge: 2022-09-03 | Disposition: A | Discharge status: Home / Self Care | Payer: 59 | Attending: Family Medicine | Admitting: Family Medicine

## 2022-09-03 DIAGNOSIS — Z8709 Personal history of other diseases of the respiratory system: Secondary | ICD-10-CM | POA: Diagnosis not present

## 2022-09-03 DIAGNOSIS — J029 Acute pharyngitis, unspecified: Secondary | ICD-10-CM

## 2022-09-03 MED ORDER — AZITHROMYCIN 250 MG PO TABS: ORAL_TABLET | ORAL | 0 refills | Status: DC

## 2022-09-03 MED ORDER — LIDOCAINE VISCOUS HCL 2 % MT SOLN: 10.0000 mL | OROMUCOSAL | 0 refills | Status: DC | PRN

## 2022-09-03 NOTE — ED Triage Notes (Signed)
Sore throat since Saturday.  Was tested for strep on Monday and was started on antibiotics.  States pain in not better and states she is spitting up some brownish fluid.   Was placed on amoxicillin.  States she finished the antibiotic this morning

## 2022-09-03 NOTE — ED Provider Notes (Signed)
[facility]    CSN: [id number] Arrival date & time: 09/03/22  1724      History   Chief Complaint Chief Complaint  Patient presents with   Sore Throat    Entered by patient    HPI [name] is a 62 y.o. female.   Patient presenting today with about a week of sore, swollen feeling throat.  States she went to her primary care 5 days ago, was diagnosed with strep throat and started on a 5-day course of amoxicillin.  She states she never got significantly better on the medication and is now finished it.  She was told to call back today for the remainder of the supply if not feeling better but that her primary care provider was out of the office today so they told her to come to urgent care.  She denies fever, chills, chest pain, shortness of breath, abdominal pain, nausea vomiting or diarrhea.  Not taking anything over-the-counter or otherwise for symptoms.     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/2018   Well woman exam with routine gynecological exam 08/30/2016   Burning with urination 08/30/2016   Hot flashes 08/30/2016   Hematuria 08/30/2016   Vaginal Pap smear following hysterectomy for malignancy 08/30/2016   Screening for colorectal cancer 08/30/2016   Mild intermittent asthma 12/15/2015   Allergic rhinitis due to pollen 12/15/2015   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   Hypertension 08/12/2015   Urinary incontinence 08/12/2015   Special screening for malignant neoplasms, colon     Past Surgical History:  Procedure Laterality Date   ABDOMINAL HYSTERECTOMY     BACK SURGERY     x3-ruptured disc   Bone spur Right    Foot   CARDIAC CATHETERIZATION  11/2009   Normal coronary arteries ([place])   CHOLECYSTECTOMY     COLONOSCOPY N/A 07/15/2014   Procedure: COLONOSCOPY;  Surgeon: Daneil Dolin, MD;  [facility];  Service: Endoscopy;  Laterality: N/A;   COLONOSCOPY WITH PROPOFOL N/A 10/19/2021   Procedure: COLONOSCOPY WITH PROPOFOL;  Surgeon: Daneil Dolin, MD;  [facility];  Service: Endoscopy;  Laterality: N/A;  9:00 / ASA 2   CYST REMOVAL HAND Right    KNEE ARTHROSCOPY Left     OB History     Gravida  1   Para  1   Term  1   Preterm      AB      Living  1      SAB      IAB      Ectopic      Multiple      Live Births  1            Home Medications    Prior to Admission medications  Medication Sig Start Date End Date Taking? Authorizing Provider  azithromycin (ZITHROMAX) 250 MG tablet Take first 2 tablets together, then 1 every day until finished. 09/03/22  Yes Volney American, PA-C  lidocaine (XYLOCAINE) 2 % solution Use as directed 10 mLs in the mouth or throat every 3 (three) hours as needed for mouth pain. 09/03/22  Yes Volney American, PA-C  acetaminophen (TYLENOL) 500 MG tablet Take 1,000 mg by mouth every 6 (six) hours as needed for moderate pain.    [provider]  albuterol (PROAIR HFA) 108 (90 Base) MCG/ACT inhaler Inhale 2 puffs into the lungs every 4 (four) hours as needed for wheezing or shortness of breath. 12/15/15   Bardelas, Jens Som, MD  benzonatate (TESSALON) 100 MG capsule Take 100 mg by mouth as needed for cough.    [provider]  brompheniramine-pseudoephedrine-DM 30-2-10 MG/5ML syrup Take 5 mLs by mouth 4 (four)  times daily as needed. 01/17/22   Leath-Warren, Alda Lea, NP  cephALEXin (KEFLEX) 500 MG capsule Take 1 capsule (500 mg total) by mouth 4 (four) times daily. 06/12/22   Sherrill Raring, PA-C  cetirizine (ZYRTEC) 10 MG tablet Take 10 mg by mouth daily in the afternoon.    [provider]  cyclobenzaprine (FLEXERIL) 10 MG tablet Take 10 mg by mouth daily as needed for muscle spasms. 05/28/21   [provider]  diazepam (VALIUM) 2 MG tablet Take 2 mg by mouth as needed (anxiety when flying).    [provider]  diltiazem (CARDIZEM CD) 300 MG 24 hr capsule Take 1 capsule (300 mg total) by mouth daily. 04/13/22   Satira Sark, MD  doxycycline (VIBRA-TABS) 100 MG tablet Take 1 tablet (100 mg total) by mouth 2 (two) times daily. 01/17/22   Leath-Warren, Alda Lea, NP  EPINEPHrine 0.3 mg/0.3 mL IJ SOAJ injection Inject 0.3 mLs (0.3 mg total) into the muscle once. Patient taking differently: Inject 0.3 mg into the muscle as needed for anaphylaxis. 12/15/15   Charlies Silvers, MD  ketorolac (TORADOL) 10 MG tablet Take 1 tablet (10 mg total) by mouth every 6 (six) hours as needed. 06/12/22   Sherrill Raring, PA-C  levothyroxine (SYNTHROID) 100 MCG tablet Take 100 mcg by mouth See admin instructions. Take 100 mcg daily except skip dose on Sundays    [provider]  olmesartan (BENICAR) 20 MG tablet Take 20 mg by mouth daily. 04/29/21   [provider]  ondansetron (ZOFRAN-ODT) 8 MG disintegrating tablet Take 1 tablet (8 mg total) by mouth every 8 (eight) hours as needed for nausea or vomiting. 06/12/22   Sherrill Raring, PA-C  Polyethyl Glycol-Propyl Glycol (SYSTANE OP) Place 1 drop into both eyes daily as needed (dry eyes).    [provider]  rosuvastatin (CRESTOR) 5 MG tablet Take 5 mg by mouth at bedtime. 05/14/21   [provider]  tamsulosin (FLOMAX) 0.4 MG CAPS capsule Take 1 capsule (0.4 mg total) by mouth daily after supper. 06/12/22   Sherrill Raring,  PA-C    Family History Family History  Problem Relation Age of Onset   COPD Mother    Cancer Mother        skin   Hernia Mother    Fibromyalgia Mother    Hypertension Mother    Asthma Mother    Other Father        open heart surgery   Hypertension Father    Hypertension Brother    Interstitial cystitis Daughter  Allergic rhinitis Daughter    Asthma Daughter    Cancer Maternal Grandmother        cervical   Heart attack Maternal Grandmother        died at age 50   Cancer Maternal Grandfather        colon   Cancer Paternal Grandmother        colon, cervical,melonoma   Other Paternal Grandmother        open heart surgery   Arthritis Paternal Grandfather    Leukemia Paternal Grandfather     Social History Social History   Tobacco Use   Smoking status: Never    Passive exposure: Never   Smokeless tobacco: Never  Vaping Use   Vaping Use: Never used  Substance Use Topics   Alcohol use: No   Drug use: No     Allergies   Other, Peanut-containing drug products, Covid-19 (mrna) vaccine, Dilaudid [hydromorphone hcl], Morphine and related, Phenergan [promethazine hcl], and Suprep bowel prep kit [na sulfate-k sulfate-mg sulf]   Review of Systems Review of Systems Per HPI  Physical Exam Triage Vital Signs ED Triage Vitals  Enc Vitals Group     BP 09/03/22 1841 (!) 147/90     Pulse Rate 09/03/22 1841 85     Resp 09/03/22 1841 18     Temp 09/03/22 1841 97.9 F (36.6 C)     Temp Source 09/03/22 1841 Oral     SpO2 09/03/22 1841 98 %     Weight --      Height --      Head Circumference --      Peak Flow --      Pain Score 09/03/22 1843 2     Pain Loc --      Pain Edu? --      Excl. in [place]? --    No data found.  Updated Vital Signs BP (!) 147/90 (BP Location: Right Arm)   Pulse 85   Temp 97.9 F (36.6 C) (Oral)   Resp 18   SpO2 98%   Visual Acuity Right Eye Distance:   Left Eye Distance:   Bilateral Distance:    Right Eye Near:   Left Eye Near:     Bilateral Near:     Physical Exam Vitals and nursing note reviewed.  Constitutional:      Appearance: Normal appearance.  HENT:     Head: Atraumatic.     Right Ear: Tympanic membrane and external ear normal.     Left Ear: Tympanic membrane and external ear normal.     Mouth/Throat:     Mouth: Mucous membranes are moist.     Pharynx: Posterior oropharyngeal erythema present.  Eyes:     Extraocular Movements: Extraocular movements intact.     Conjunctiva/sclera: Conjunctivae normal.  Cardiovascular:     Rate and Rhythm: Normal rate and regular rhythm.     Heart sounds: Normal heart sounds.  Pulmonary:     Effort: Pulmonary effort is normal.     Breath sounds: Normal breath sounds. No wheezing.  Musculoskeletal:        General: Normal range of motion.     Cervical back: Normal range of motion and neck supple.  Lymphadenopathy:     Cervical: Cervical adenopathy present.  Skin:    General: Skin is warm and dry.  Neurological:     Mental Status: She is alert and oriented to person, place, and time.  Psychiatric:  Mood and Affect: Mood normal.        Thought Content: Thought content normal.      UC Treatments / Results  Labs (all labs ordered are listed, but only abnormal results are displayed) Labs Reviewed - No data to display  EKG   Radiology No results found.  Procedures Procedures (including critical care time)  Medications Ordered in UC Medications - No data to display  Initial Impression / Assessment and Plan / UC Course  I have reviewed the triage vital signs and the nursing notes.  Pertinent labs & imaging results that were available during my care of the patient were reviewed by me and considered in my medical decision making (see chart for details).     Given poor response to the Amoxil, will switch to azithromycin.  It is unclear why her PCP only prescribed her a partial course of the Amoxil but instead of finishing the last 5 days will  just switch to a different agent because as stated before she was not responding to the Amoxil.  Viscous lidocaine, supportive over-the-counter medications and home care reviewed.  Return for worsening symptoms.  Final Clinical Impressions(s) / UC Diagnoses   Final diagnoses:  Acute pharyngitis, unspecified etiology  History of strep pharyngitis   Discharge Instructions   None    ED Prescriptions     Medication Sig Dispense Auth. Provider   azithromycin (ZITHROMAX) 250 MG tablet Take first 2 tablets together, then 1 every day until finished. 6 tablet Volney American, PA-C   lidocaine (XYLOCAINE) 2 % solution Use as directed 10 mLs in the mouth or throat every 3 (three) hours as needed for mouth pain. 100 mL Volney American, [place]      PDMP not reviewed this encounter.   Volney American, [place] 09/03/22 1928

## 2022-09-15 ENCOUNTER — Ambulatory Visit
Admission: RE | Admit: 2022-09-15 | Discharge: 2022-09-15 | Disposition: A | Discharge status: Home / Self Care | Payer: 59 | Source: Ambulatory Visit | Attending: Nurse Practitioner | Admitting: Nurse Practitioner

## 2022-09-15 VITALS — BP 175/92 | HR 75 | Temp 97.8°F | Resp 16

## 2022-09-15 DIAGNOSIS — J029 Acute pharyngitis, unspecified: Secondary | ICD-10-CM | POA: Diagnosis not present

## 2022-09-15 DIAGNOSIS — R21 Rash and other nonspecific skin eruption: Secondary | ICD-10-CM

## 2022-09-15 DIAGNOSIS — H04123 Dry eye syndrome of bilateral lacrimal glands: Secondary | ICD-10-CM | POA: Diagnosis not present

## 2022-09-15 LAB — POCT RAPID STREP A (OFFICE): Rapid Strep A Screen: NEGATIVE

## 2022-09-15 MED ORDER — DEXAMETHASONE SODIUM PHOSPHATE 10 MG/ML IJ SOLN
10.0000 mg | INTRAMUSCULAR | Status: AC
  Administered 2022-09-15: 10 mg via INTRAMUSCULAR

## 2022-09-15 MED ORDER — AMOXICILLIN-POT CLAVULANATE 875-125 MG PO TABS: 1.0000 | ORAL_TABLET | Freq: Two times a day (BID) | ORAL | 0 refills | Status: DC

## 2022-09-15 NOTE — ED Provider Notes (Signed)
[facility]    CSN: [id number] Arrival date & time: 09/15/22  1113      History   Chief Complaint Chief Complaint  Patient presents with   Sore Throat    Had strep throat almost 4 weeks ago. 10 days antibiotics. Throat still hurts and now have a rash on chest! Dr. Hilma Favors is off and the other are booked. Thanks for your help! - Entered by patient   Appointment    [facility]    HPI [name] is a 62 y.o. female.   The history is provided by the patient.   The patient presents for continued sore throat.  Symptoms started approximately 4 weeks ago.  Patient states that she saw her PCP initially and tested positive for strep throat.  She states she was given a prescription for amoxicillin for 5 days, and advised to follow-up with her office if her symptoms had not improved with that course of treatment.  Patient states symptoms did not improve, but when she went to follow-up, their office was closed so she came to this clinic.  She was prescribed azithromycin 250 mg at that time.  She states that she finished the antibiotic more than 1 week ago.  She states that she continues to experience sore throat since completing the antibiotic, she also states that she has seen white patches on her tonsils.  Patient states that she also has thick drainage in the back of her throat, that "sticks" to her tonsils.  She states that over the past 24 hours, she also developed a rash to her chest.  She denies any new fever, chills, headache, ear pain, chest pain, nausea, vomiting, or diarrhea.  She states that she did use an over-the-counter acne cream to the rash on her chest which did help somewhat.  She states that she did call her doctor's office but, but they told her to take Tylenol only.  She states she did notice some relief of pain with the Tylenol, but she states that the throat pain continues to persist.  Past Medical History:  Diagnosis Date   Asthma    Atypical nevus 10/26/2012    1.RIGHT ANT THIGH -MODERATE, 2.RIGHT UPPER ABDOMEN- MILD, 3.RIGHT LABIA -MILD, 4.RIGHT UPPER BACK -MILD   Chronic back pain    Essential hypertension    History of cardiac catheterization 2011   Reportedly normal coronary arteries - [facility]   History of kidney stones    History of mitral valve prolapse    History of skin cancer    Hypothyroidism    Urinary incontinence 08/12/2015    Patient Active Problem List   Diagnosis Date Noted   Encounter for well woman exam with routine gynecological exam 09/13/2018   Lumbar stenosis with neurogenic claudication 05/15/2018   Well woman exam with routine gynecological exam 08/30/2016   Burning with urination 08/30/2016   Hot flashes 08/30/2016   Hematuria 08/30/2016   Vaginal Pap smear following hysterectomy for malignancy 08/30/2016   Screening for colorectal cancer 08/30/2016   Mild intermittent asthma 12/15/2015   Allergic rhinitis due to pollen 12/15/2015   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   Hypertension 08/12/2015   Urinary incontinence 08/12/2015   Special screening for malignant neoplasms, colon     Past Surgical History:  Procedure Laterality Date   ABDOMINAL HYSTERECTOMY     BACK SURGERY     x3-ruptured disc   Bone spur Right    Foot   CARDIAC CATHETERIZATION  11/2009  Normal coronary arteries ([place])   CHOLECYSTECTOMY     COLONOSCOPY N/A 07/15/2014   Procedure: COLONOSCOPY;  Surgeon: Daneil Dolin, MD;  [facility];  Service: Endoscopy;  Laterality: N/A;   COLONOSCOPY WITH PROPOFOL N/A 10/19/2021   Procedure: COLONOSCOPY WITH PROPOFOL;  Surgeon: Daneil Dolin, MD;  [facility];  Service: Endoscopy;  Laterality: N/A;  9:00 / ASA 2   CYST REMOVAL HAND Right    KNEE ARTHROSCOPY Left     OB History     Gravida  1   Para  1   Term  1   Preterm      AB      Living  1      SAB      IAB      Ectopic      Multiple      Live Births  1            Home  Medications    Prior to Admission medications   Medication Sig Start Date End Date Taking? Authorizing Provider  amoxicillin-clavulanate (AUGMENTIN) 875-125 MG tablet Take 1 tablet by mouth every 12 (twelve) hours. 09/15/22  Yes Keni Elison-Warren, Alda Lea, NP  acetaminophen (TYLENOL) 500 MG tablet Take 1,000 mg by mouth every 6 (six) hours as needed for moderate pain.    [provider]  albuterol (PROAIR HFA) 108 (90 Base) MCG/ACT inhaler Inhale 2 puffs into the lungs every 4 (four) hours as needed for wheezing or shortness of breath. 12/15/15   Bardelas, Jens Som, MD  azithromycin (ZITHROMAX) 250 MG tablet Take first 2 tablets together, then 1 every day until finished. 09/03/22   Volney American, PA-C  benzonatate (TESSALON) 100 MG capsule Take 100 mg by mouth as needed for cough.    [provider]  brompheniramine-pseudoephedrine-DM 30-2-10 MG/5ML syrup Take 5 mLs by mouth 4 (four) times daily as needed. 01/17/22   Ember Gottwald-Warren, Alda Lea, NP  cephALEXin (KEFLEX) 500 MG capsule Take 1 capsule (500 mg total) by mouth 4 (four) times daily. 06/12/22   Sherrill Raring, PA-C  cetirizine (ZYRTEC) 10 MG tablet Take 10 mg by mouth daily in the afternoon.    [provider]  cyclobenzaprine (FLEXERIL) 10 MG tablet Take 10 mg by mouth daily as needed for muscle spasms. 05/28/21   [provider]  diazepam (VALIUM) 2 MG tablet Take 2 mg by mouth as needed (anxiety when flying).    [provider]  diltiazem (CARDIZEM CD) 300 MG 24 hr capsule Take 1 capsule (300 mg total) by mouth daily. 04/13/22   Satira Sark, MD  doxycycline (VIBRA-TABS) 100 MG tablet Take 1 tablet (100 mg total) by mouth 2 (two) times daily. 01/17/22   Kimberely Mccannon-Warren, Alda Lea, NP  EPINEPHrine 0.3 mg/0.3 mL IJ SOAJ injection Inject 0.3 mLs (0.3 mg total) into the muscle once. Patient taking differently: Inject 0.3 mg into the muscle as needed for anaphylaxis. 12/15/15   Charlies Silvers, MD   ketorolac (TORADOL) 10 MG tablet Take 1 tablet (10 mg total) by mouth every 6 (six) hours as needed. 06/12/22   Sherrill Raring, PA-C  levothyroxine (SYNTHROID) 100 MCG tablet Take 100 mcg by mouth See admin instructions. Take 100 mcg daily except skip dose on Sundays    [provider]  lidocaine (XYLOCAINE) 2 % solution Use as directed 10 mLs in the mouth or throat every 3 (three) hours as needed for mouth pain. 09/03/22   Orene Desanctis,  Lilia Argue, PA-C  olmesartan (BENICAR) 20 MG tablet Take 20 mg by mouth daily. 04/29/21   [provider]  ondansetron (ZOFRAN-ODT) 8 MG disintegrating tablet Take 1 tablet (8 mg total) by mouth every 8 (eight) hours as needed for nausea or vomiting. 06/12/22   Sherrill Raring, PA-C  Polyethyl Glycol-Propyl Glycol (SYSTANE OP) Place 1 drop into both eyes daily as needed (dry eyes).    [provider]  rosuvastatin (CRESTOR) 5 MG tablet Take 5 mg by mouth at bedtime. 05/14/21   [provider]  tamsulosin (FLOMAX) 0.4 MG CAPS capsule Take 1 capsule (0.4 mg total) by mouth daily after supper. 06/12/22   Sherrill Raring, PA-C    Family History Family History  Problem Relation Age of Onset   COPD Mother    Cancer Mother        skin   Hernia Mother    Fibromyalgia Mother    Hypertension Mother    Asthma Mother    Other Father        open heart surgery   Hypertension Father    Hypertension Brother    Interstitial cystitis Daughter    Allergic rhinitis Daughter    Asthma Daughter    Cancer Maternal Grandmother        cervical   Heart attack Maternal Grandmother        died at age 50   Cancer Maternal Grandfather        colon   Cancer Paternal Grandmother        colon, cervical,melonoma   Other Paternal Grandmother        open heart surgery   Arthritis Paternal Grandfather    Leukemia Paternal Grandfather     Social History Social History   Tobacco Use   Smoking status: Never    Passive exposure: Never   Smokeless tobacco:  Never  Vaping Use   Vaping Use: Never used  Substance Use Topics   Alcohol use: No   Drug use: No     Allergies   Other, Peanut-containing drug products, Covid-19 (mrna) vaccine, Dilaudid [hydromorphone hcl], Morphine and related, Phenergan [promethazine hcl], and Suprep bowel prep kit [na sulfate-k sulfate-mg sulf]   Review of Systems Review of Systems Per HPI  Physical Exam Triage Vital Signs ED Triage Vitals [09/15/22 1211]  Enc Vitals Group     BP (!) 175/92     Pulse Rate 75     Resp 16     Temp 97.8 F (36.6 C)     Temp Source Oral     SpO2 98 %     Weight      Height      Head Circumference      Peak Flow      Pain Score      Pain Loc      Pain Edu?      Excl. in [place]?    No data found.  Updated Vital Signs BP (!) 175/92 (BP Location: Right Arm)   Pulse 75   Temp 97.8 F (36.6 C) (Oral)   Resp 16   SpO2 98%   Visual Acuity Right Eye Distance:   Left Eye Distance:   Bilateral Distance:    Right Eye Near:   Left Eye Near:    Bilateral Near:     Physical Exam Vitals and nursing note reviewed.  Constitutional:      General: She is not in acute distress.    Appearance: She is well-developed.  HENT:     Head: Normocephalic.     Right Ear: Tympanic membrane and ear canal normal.     Left Ear: Tympanic membrane and ear canal normal.     Nose: No congestion or rhinorrhea.     Mouth/Throat:     Pharynx: Uvula midline. Pharyngeal swelling, posterior oropharyngeal erythema and uvula swelling present.  Eyes:     Conjunctiva/sclera: Conjunctivae normal.     Pupils: Pupils are equal, round, and reactive to light.  Cardiovascular:     Rate and Rhythm: Normal rate and regular rhythm.     Heart sounds: Normal heart sounds.  Pulmonary:     Effort: Pulmonary effort is normal.     Breath sounds: Normal breath sounds.  Abdominal:     General: Bowel sounds are normal.     Palpations: Abdomen is soft.     Tenderness: There is no abdominal tenderness.   Musculoskeletal:     Cervical back: Normal range of motion.  Lymphadenopathy:     Cervical: No cervical adenopathy.  Skin:    General: Skin is warm and dry.  Neurological:     General: No focal deficit present.     Mental Status: She is alert and oriented to person, place, and time.  Psychiatric:        Mood and Affect: Mood normal.        Behavior: Behavior normal.      UC Treatments / Results  Labs (all labs ordered are listed, but only abnormal results are displayed) Labs Reviewed  CULTURE, GROUP A STREP [facility])  POCT RAPID STREP A (OFFICE)    EKG   Radiology No results found.  Procedures Procedures (including critical care time)  Medications Ordered in UC Medications  dexamethasone (DECADRON) injection 10 mg (10 mg Intramuscular Given 09/15/22 1301)    Initial Impression / Assessment and Plan / UC Course  I have reviewed the triage vital signs and the nursing notes.  Pertinent labs & imaging results that were available during my care of the patient were reviewed by me and considered in my medical decision making (see chart for details).  The patient is well-appearing, she is in no acute distress, vital signs are stable.  Sore throat has been present for the past 4 weeks, she has been treated with 2 rounds of antibiotics.  The rapid strep test is negative today, throat culture is pending.  Based on the ongoing symptoms of sore throat, will treat with Augmentin 875/125 mg tablets to cover for bacterial etiology.  For her rash, Decadron 10 mg IM was administered in the office today, this will also help with tonsil swelling.  Supportive care recommendations were provided to the patient to include increasing fluids, allowing for plenty of rest, taking supplements to increase her immunity, and warm salt water gargles.  Patient is in agreement with this plan of care and verbalizes understanding.  All questions were answered.  Patient stable for discharge.  Final Clinical  Impressions(s) / UC Diagnoses   Final diagnoses:  Acute pharyngitis, unspecified etiology  Rash and nonspecific skin eruption     Discharge Instructions      The rapid strep test was negative.  A throat culture has been ordered.  You will be contacted if the pending test results are positive. Take medication as prescribed. Increase fluids and allow for plenty of rest. Warm salt water gargles 3-4 times daily as needed for throat pain or discomfort. Recommend continuing over-the-counter Tylenol for pain, fever, or general  discomfort. Recommend a soft diet while throat symptoms persist.  This includes soup, broth, yogurt, pudding, Jell-O, and popsicles. To boost your immunity, recommend taking over-the-counter zinc, Echinacea, or elderberry daily.  For the rash: May take over-the-counter Benadryl at bedtime as needed for itching. Continue use of over-the-counter anti-itch medications such as hydrocortisone cream, calamine lotion, or the cream that you are currently using to help with symptoms. Avoid scratching or rubbing the areas while symptoms persist. May apply cool cloths to the areas to help with itching or discomfort.  If symptoms fail to improve with this treatment, please follow-up with your primary care physician for further evaluation. Follow-up as needed.     ED Prescriptions     Medication Sig Dispense Auth. Provider   amoxicillin-clavulanate (AUGMENTIN) 875-125 MG tablet Take 1 tablet by mouth every 12 (twelve) hours. 14 tablet Coryn Mosso-Warren, Alda Lea, NP      PDMP not reviewed this encounter.   Tish Men, NP 09/15/22 [phone]

## 2022-09-15 NOTE — Discharge Instructions (Addendum)
The rapid strep test was negative.  A throat culture has been ordered.  You will be contacted if the pending test results are positive. Take medication as prescribed. Increase fluids and allow for plenty of rest. Warm salt water gargles 3-4 times daily as needed for throat pain or discomfort. Recommend continuing over-the-counter Tylenol for pain, fever, or general discomfort. Recommend a soft diet while throat symptoms persist.  This includes soup, broth, yogurt, pudding, Jell-O, and popsicles. To boost your immunity, recommend taking over-the-counter zinc, Echinacea, or elderberry daily.  For the rash: May take over-the-counter Benadryl at bedtime as needed for itching. Continue use of over-the-counter anti-itch medications such as hydrocortisone cream, calamine lotion, or the cream that you are currently using to help with symptoms. Avoid scratching or rubbing the areas while symptoms persist. May apply cool cloths to the areas to help with itching or discomfort.  If symptoms fail to improve with this treatment, please follow-up with your primary care physician for further evaluation. Follow-up as needed.

## 2022-09-15 NOTE — ED Triage Notes (Signed)
Pt reports sore throat x 4 weeks, was treated for Strep 2 weeks ago; rash in chest and face x 1 day; abdominal discomfort today.

## 2022-09-18 LAB — CULTURE, GROUP A STREP (THRC)

## 2022-10-08 DIAGNOSIS — J020 Streptococcal pharyngitis: Secondary | ICD-10-CM | POA: Diagnosis not present

## 2022-10-08 DIAGNOSIS — E538 Deficiency of other specified B group vitamins: Secondary | ICD-10-CM | POA: Diagnosis not present

## 2022-10-08 DIAGNOSIS — Z6823 Body mass index (BMI) 23.0-23.9, adult: Secondary | ICD-10-CM | POA: Diagnosis not present

## 2022-10-08 DIAGNOSIS — J4520 Mild intermittent asthma, uncomplicated: Secondary | ICD-10-CM | POA: Diagnosis not present

## 2022-10-10 NOTE — Progress Notes (Unsigned)
    Cardiology Office Note  Date: 10/11/2022   ID: [name], DOB 02/22/1961, MRN [id number]  History of Present Illness: [name] is a 62 y.o. female last seen in September 2023.  She is here for a follow-up visit.  Since last encounter she does not report any progressive sense of palpitations, generally brief and most noticeable in the evenings.  No exercise intolerance, no exertional chest pain or syncope.  I reviewed her medications, she continues on Cardizem CD 300 mg daily.  She is also on Benicar for blood pressure control.  Physical Exam: VS:  BP 132/84   Pulse 87   Ht 5\' 5"  (1.651 m)   Wt 144 lb 3.2 oz (65.4 kg)   SpO2 99%   BMI 24.00 kg/m , BMI Body mass index is 24 kg/m.  Wt Readings from Last 3 Encounters:  10/11/22 144 lb 3.2 oz (65.4 kg)  06/12/22 154 lb (69.9 kg)  04/13/22 150 lb (68 kg)    General: Patient appears comfortable at rest. HEENT: Conjunctiva and lids normal. Neck: Supple, no elevated JVP or carotid bruits. Lungs: Clear to auscultation, nonlabored breathing at rest. Cardiac: Regular rate and rhythm, no S3 or significant systolic murmur.  ECG:  An ECG dated 06/14/2022 was personally reviewed today and demonstrated:  Sinus rhythm.  Labwork: 06/12/2022: ALT 38; AST 39; BUN 20; Creatinine, Ser 0.87; Hemoglobin 13.5; Platelets 185; Potassium 3.5; Sodium 137   Other Studies Reviewed Today:  No interval cardiac testing for review today.  Assessment and Plan:  1.  Palpitations with history of brief SVT documented by cardiac monitor in 2020.  She has been managed with Cardizem CD.  Echocardiogram from February 2020 revealed LVEF greater than 65% and no major valvular abnormalities.  At this point we will continue with observation on current dose of Cardizem CD.  2.  Essential hypertension.  Continue Benicar and keep follow-up with PCP.  Disposition:  Follow up  6 months.  Signed, Satira Sark, M.D., F.A.C.C.

## 2022-10-11 ENCOUNTER — Ambulatory Visit: Payer: 59 | Attending: Cardiology | Admitting: Cardiology

## 2022-10-11 ENCOUNTER — Encounter: Payer: Self-pay | Admitting: Cardiology

## 2022-10-11 VITALS — BP 132/84 | HR 87 | Ht 65.0 in | Wt 144.2 lb

## 2022-10-11 DIAGNOSIS — R002 Palpitations: Secondary | ICD-10-CM

## 2022-10-11 DIAGNOSIS — I471 Supraventricular tachycardia, unspecified: Secondary | ICD-10-CM

## 2022-10-11 DIAGNOSIS — I10 Essential (primary) hypertension: Secondary | ICD-10-CM | POA: Diagnosis not present

## 2022-10-11 NOTE — Patient Instructions (Addendum)

## 2022-10-12 DIAGNOSIS — K219 Gastro-esophageal reflux disease without esophagitis: Secondary | ICD-10-CM | POA: Diagnosis not present

## 2022-10-12 DIAGNOSIS — J0300 Acute streptococcal tonsillitis, unspecified: Secondary | ICD-10-CM | POA: Diagnosis not present

## 2022-10-19 DIAGNOSIS — K219 Gastro-esophageal reflux disease without esophagitis: Secondary | ICD-10-CM | POA: Diagnosis not present

## 2022-10-19 DIAGNOSIS — Z6823 Body mass index (BMI) 23.0-23.9, adult: Secondary | ICD-10-CM | POA: Diagnosis not present

## 2022-11-26 DIAGNOSIS — M79605 Pain in left leg: Secondary | ICD-10-CM | POA: Diagnosis not present

## 2022-12-30 ENCOUNTER — Other Ambulatory Visit: Payer: Self-pay | Admitting: Cardiology

## 2023-03-01 ENCOUNTER — Ambulatory Visit
Admission: RE | Admit: 2023-03-01 | Discharge: 2023-03-01 | Disposition: A | Discharge status: Home / Self Care | Payer: 59 | Source: Ambulatory Visit | Attending: Family Medicine | Admitting: Family Medicine

## 2023-03-01 ENCOUNTER — Ambulatory Visit (INDEPENDENT_AMBULATORY_CARE_PROVIDER_SITE_OTHER): Payer: 59

## 2023-03-01 VITALS — BP 138/83 | HR 63 | Temp 98.0°F | Resp 20

## 2023-03-01 DIAGNOSIS — M79644 Pain in right finger(s): Secondary | ICD-10-CM | POA: Diagnosis not present

## 2023-03-01 DIAGNOSIS — W19XXXA Unspecified fall, initial encounter: Secondary | ICD-10-CM | POA: Diagnosis not present

## 2023-03-01 NOTE — ED Triage Notes (Signed)
Pt reports she fell 2 times in the last 5 days.   Reports she is not dizzy but keeps falling. Denies head injury but has injured her right 5th metacarpal and now the pain is radiating up to her forearm.   Pinky finger is swollen

## 2023-03-04 NOTE — ED Provider Notes (Signed)
[facility]    CSN: [id number] Arrival date & time: 03/01/23  1153      History   Chief Complaint Chief Complaint  Patient presents with   Fall    Entered by patient    HPI [name] is a 62 y.o. female.   Patient presenting today with pain and swelling to the right fifth finger down into hand after a fall several days ago.  Denies numbness, tingling, skin injury, other areas of pain from the fall.  So far has been trying rest, over-the-counter pain relievers with no relief.    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/2018   Well woman exam with routine gynecological exam 08/30/2016   Burning with urination 08/30/2016   Hot flashes 08/30/2016   Hematuria 08/30/2016   Vaginal Pap smear following hysterectomy for malignancy 08/30/2016   Screening for colorectal cancer 08/30/2016   Mild intermittent asthma 12/15/2015   Allergic rhinitis due to pollen 12/15/2015   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   Hypertension 08/12/2015   Urinary incontinence 08/12/2015   Special screening for malignant neoplasms, colon     Past Surgical History:  Procedure Laterality Date   ABDOMINAL HYSTERECTOMY     BACK SURGERY     x3-ruptured disc   Bone spur Right    Foot   CARDIAC CATHETERIZATION  11/2009   Normal coronary arteries ([facility])   CHOLECYSTECTOMY      COLONOSCOPY N/A 07/15/2014   Procedure: COLONOSCOPY;  Surgeon: Corbin Ade, MD;  Location: [facility];  Service: Endoscopy;  Laterality: N/A;   COLONOSCOPY WITH PROPOFOL N/A 10/19/2021   Procedure: COLONOSCOPY WITH PROPOFOL;  Surgeon: Corbin Ade, MD;  Location: [facility];  Service: Endoscopy;  Laterality: N/A;  9:00 / ASA 2   CYST REMOVAL HAND Right    KNEE ARTHROSCOPY Left     OB History     Gravida  1   Para  1   Term  1   Preterm      AB      Living  1      SAB      IAB      Ectopic      Multiple      Live Births  1            Home Medications    Prior to Admission medications   Medication Sig Start Date End Date Taking? Authorizing Provider  acetaminophen (TYLENOL) 500 MG tablet Take 1,000 mg by mouth every 6 (six) hours as needed for moderate pain.    [provider]  albuterol (PROAIR HFA) 108 (90 Base) MCG/ACT inhaler Inhale 2 puffs into the lungs every 4 (four) hours as needed for wheezing or shortness of breath. 12/15/15   Beaulah Dinning, Bonnita Hollow, MD  amoxicillin-clavulanate (  AUGMENTIN) 875-125 MG tablet Take 1 tablet by mouth every 12 (twelve) hours. 09/15/22   Leath-Warren, Sadie Haber, NP  benzonatate (TESSALON) 100 MG capsule Take 100 mg by mouth as needed for cough.    [provider]  brompheniramine-pseudoephedrine-DM 30-2-10 MG/5ML syrup Take 5 mLs by mouth 4 (four) times daily as needed. 01/17/22   Leath-Warren, Sadie Haber, NP  cetirizine (ZYRTEC) 10 MG tablet Take 10 mg by mouth daily in the afternoon.    [provider]  cyclobenzaprine (FLEXERIL) 10 MG tablet Take 10 mg by mouth daily as needed for muscle spasms. 05/28/21   [provider]  diazepam (VALIUM) 2 MG tablet Take 2 mg by mouth as needed (anxiety when flying).    [provider]  diltiazem (CARDIZEM CD) 300 MG 24 hr capsule TAKE ONE CAPSULE BY MOUTH ONCE DAILY. 12/30/22   Jonelle Sidle, MD  doxycycline (VIBRA-TABS) 100 MG tablet Take  1 tablet (100 mg total) by mouth 2 (two) times daily. Patient not taking: Reported on 10/11/2022 01/17/22   Leath-Warren, Sadie Haber, NP  EPINEPHrine 0.3 mg/0.3 mL IJ SOAJ injection Inject 0.3 mLs (0.3 mg total) into the muscle once. Patient taking differently: Inject 0.3 mg into the muscle as needed for anaphylaxis. 12/15/15   Fletcher Anon, MD  levothyroxine (SYNTHROID) 100 MCG tablet Take 100 mcg by mouth See admin instructions. Take 100 mcg daily except skip dose on Sundays    [provider]  olmesartan (BENICAR) 20 MG tablet Take 20 mg by mouth daily. 04/29/21   [provider]  ondansetron (ZOFRAN-ODT) 8 MG disintegrating tablet Take 1 tablet (8 mg total) by mouth every 8 (eight) hours as needed for nausea or vomiting. 06/12/22   Theron Arista, PA-C  Polyethyl Glycol-Propyl Glycol (SYSTANE OP) Place 1 drop into both eyes daily as needed (dry eyes).    [provider]  rosuvastatin (CRESTOR) 5 MG tablet Take 5 mg by mouth at bedtime. Patient not taking: Reported on 10/11/2022 05/14/21   [provider]  tamsulosin (FLOMAX) 0.4 MG CAPS capsule Take 1 capsule (0.4 mg total) by mouth daily after supper. Patient not taking: Reported on 10/11/2022 06/12/22   Theron Arista, PA-C    Family History Family History  Problem Relation Age of Onset   COPD Mother    Cancer Mother        skin   Hernia Mother    Fibromyalgia Mother    Hypertension Mother    Asthma Mother    Other Father        open heart surgery   Hypertension Father    Hypertension Brother    Interstitial cystitis Daughter    Allergic rhinitis Daughter    Asthma Daughter    Cancer Maternal Grandmother        cervical   Heart attack Maternal Grandmother        died at age 9   Cancer Maternal Grandfather        colon   Cancer Paternal Grandmother        colon, cervical,melonoma   Other Paternal Grandmother        open heart surgery   Arthritis Paternal Grandfather    Leukemia Paternal  Grandfather     Social History Social History   Tobacco Use   Smoking status: Never    Passive exposure: Never   Smokeless tobacco: Never  Vaping Use   Vaping status: Never Used  Substance Use Topics   Alcohol use: No  Drug use: No     Allergies   Other, Peanut-containing drug products, Covid-19 (mrna) vaccine, Dilaudid [hydromorphone hcl], Morphine and codeine, Phenergan [promethazine hcl], and Suprep bowel prep kit [na sulfate-k sulfate-mg sulf]   Review of Systems Review of Systems Per HPI  Physical Exam Triage Vital Signs ED Triage Vitals  Encounter Vitals Group     BP 03/01/23 1213 138/83     Systolic BP Percentile --      Diastolic BP Percentile --      Pulse Rate 03/01/23 1209 63     Resp 03/01/23 1209 20     Temp 03/01/23 1209 98 F (36.7 C)     Temp Source 03/01/23 1209 Oral     SpO2 03/01/23 1209 97 %     Weight --      Height --      Head Circumference --      Peak Flow --      Pain Score 03/01/23 1213 7     Pain Loc --      Pain Education --      Exclude from Growth Chart --    No data found.  Updated Vital Signs BP 138/83 (BP Location: Right Arm)   Pulse 63   Temp 98 F (36.7 C) (Oral)   Resp 20   SpO2 97%   Visual Acuity Right Eye Distance:   Left Eye Distance:   Bilateral Distance:    Right Eye Near:   Left Eye Near:    Bilateral Near:     Physical Exam Vitals and nursing note reviewed.  Constitutional:      Appearance: Normal appearance. She is not ill-appearing.  HENT:     Head: Atraumatic.  Eyes:     Extraocular Movements: Extraocular movements intact.     Conjunctiva/sclera: Conjunctivae normal.  Cardiovascular:     Rate and Rhythm: Normal rate and regular rhythm.     Heart sounds: Normal heart sounds.  Pulmonary:     Effort: Pulmonary effort is normal.     Breath sounds: Normal breath sounds.  Musculoskeletal:        General: Swelling, tenderness and signs of injury present. No deformity. Normal range of motion.      Cervical back: Normal range of motion and neck supple.     Comments: Edema, tenderness to palpation down the right fifth finger into fifth metacarpal  Skin:    General: Skin is warm and dry.     Findings: No bruising or erythema.  Neurological:     Mental Status: She is alert and oriented to person, place, and time.     Comments: Right upper extremity neurovascular intact  Psychiatric:        Mood and Affect: Mood normal.        Thought Content: Thought content normal.        Judgment: Judgment normal.      UC Treatments / Results  Labs (all labs ordered are listed, but only abnormal results are displayed) Labs Reviewed - No data to display  EKG   Radiology No results found.  Procedures Procedures (including critical care time)  Medications Ordered in UC Medications - No data to display  Initial Impression / Assessment and Plan / UC Course  I have reviewed the triage vital signs and the nursing notes.  Pertinent labs & imaging results that were available during my care of the patient were reviewed by me and considered in my medical decision making (see chart for  details).     X-ray negative for acute bony abnormalities of the fifth finger of the right hand.  Finger splint placed for protection and comfort, discussed RICE protocol, over-the-counter pain relievers.  Return for worsening symptoms.  Final Clinical Impressions(s) / UC Diagnoses   Final diagnoses:  Finger pain, right  Fall, initial encounter   Discharge Instructions   None    ED Prescriptions   None    PDMP not reviewed this encounter.   Roosvelt Maser [place], [place] 03/04/23 [phone]

## 2023-03-08 DIAGNOSIS — R002 Palpitations: Secondary | ICD-10-CM | POA: Diagnosis not present

## 2023-03-08 DIAGNOSIS — E559 Vitamin D deficiency, unspecified: Secondary | ICD-10-CM | POA: Diagnosis not present

## 2023-03-08 DIAGNOSIS — E538 Deficiency of other specified B group vitamins: Secondary | ICD-10-CM | POA: Diagnosis not present

## 2023-03-08 DIAGNOSIS — I341 Nonrheumatic mitral (valve) prolapse: Secondary | ICD-10-CM | POA: Diagnosis not present

## 2023-03-08 DIAGNOSIS — E890 Postprocedural hypothyroidism: Secondary | ICD-10-CM | POA: Diagnosis not present

## 2023-04-20 ENCOUNTER — Ambulatory Visit: Payer: 59 | Attending: Cardiology | Admitting: Cardiology

## 2023-04-20 ENCOUNTER — Encounter: Payer: Self-pay | Admitting: Cardiology

## 2023-04-20 VITALS — BP 138/80 | HR 80 | Ht 65.0 in | Wt 155.6 lb

## 2023-04-20 DIAGNOSIS — I471 Supraventricular tachycardia, unspecified: Secondary | ICD-10-CM

## 2023-04-20 DIAGNOSIS — I10 Essential (primary) hypertension: Secondary | ICD-10-CM | POA: Diagnosis not present

## 2023-04-20 NOTE — Progress Notes (Signed)
Cardiology Office Note  Date: 04/20/2023   ID: Denea, G[place] 07/20/1961, MRN [id number]  History of Present Illness: [name] is a 62 y.o. female last seen in March.  She is here for a routine visit.  Reports no significant palpitations or exertional chest pain.  She continues on Cardizem CD as before.  Does mention having a few falls in the interim, not prompted by anything in particular, no loss of consciousness.  No associated palpitations when this occurs.  I reviewed her lab work from August.  She continues to follow with Dr. Phillips Odor.  ECG today shows normal sinus rhythm with nonspecific T wave changes.  Physical Exam: VS:  BP 138/80 (BP Location: Left Arm)   Pulse 80   Ht 5\' 5"  (1.651 m)   Wt 155 lb 9.6 oz (70.6 kg)   SpO2 99%   BMI 25.89 kg/m , BMI Body mass index is 25.89 kg/m.  Wt Readings from Last 3 Encounters:  04/20/23 155 lb 9.6 oz (70.6 kg)  10/11/22 144 lb 3.2 oz (65.4 kg)  06/12/22 154 lb (69.9 kg)    General: Patient appears comfortable at rest. HEENT: Conjunctiva and lids normal. Neck: Supple, no elevated JVP or carotid bruits. Lungs: Clear to auscultation, nonlabored breathing at rest. Cardiac: Regular rate and rhythm, no S3 or significant systolic murmur. Extremities: No pitting edema.  ECG:  An ECG dated 06/14/2022 was personally reviewed today and demonstrated:  Sinus rhythm.  Labwork: 06/12/2022: ALT 38; AST 39; BUN 20; Creatinine, Ser 0.87; Hemoglobin 13.5; Platelets 185; Potassium 3.5; Sodium 137  August 2024: Hemoglobin 14.2, platelets 218, BUN 18, creatinine 0.72, potassium 4.4, AST 17, ALT 16, hemoglobin A1c 5.5%, TSH 1.98  Other Studies Reviewed Today:  No interval cardiac testing for review today.  Assessment and Plan:  1.  Palpitations with history of brief SVT documented by cardiac monitor in 2020.  She has been managed with Cardizem CD.  Echocardiogram from February 2020 revealed LVEF greater than 65% and no major  valvular abnormalities.  She does not report any progressive symptoms.  No frank syncope, although she has had a few falls as discussed above.   2.  Essential hypertension.  Continue Benicar and keep follow-up with PCP.  Systolic is in the 130s today.  Disposition:  Follow up  6 months.  Signed, Jonelle Sidle, M.D., F.A.C.C. [facility]

## 2023-04-20 NOTE — Patient Instructions (Addendum)

## 2023-05-05 ENCOUNTER — Other Ambulatory Visit: Payer: Self-pay | Admitting: Family Medicine

## 2023-05-05 DIAGNOSIS — Z6826 Body mass index (BMI) 26.0-26.9, adult: Secondary | ICD-10-CM | POA: Diagnosis not present

## 2023-05-05 DIAGNOSIS — R519 Headache, unspecified: Secondary | ICD-10-CM

## 2023-05-05 DIAGNOSIS — Z23 Encounter for immunization: Secondary | ICD-10-CM | POA: Diagnosis not present

## 2023-05-05 DIAGNOSIS — R296 Repeated falls: Secondary | ICD-10-CM

## 2023-05-05 DIAGNOSIS — D518 Other vitamin B12 deficiency anemias: Secondary | ICD-10-CM | POA: Diagnosis not present

## 2023-05-06 ENCOUNTER — Other Ambulatory Visit: Payer: Self-pay | Admitting: Family Medicine

## 2023-05-06 DIAGNOSIS — Z1239 Encounter for other screening for malignant neoplasm of breast: Secondary | ICD-10-CM

## 2023-05-17 ENCOUNTER — Other Ambulatory Visit: Payer: Self-pay | Admitting: Family Medicine

## 2023-05-17 DIAGNOSIS — Z1239 Encounter for other screening for malignant neoplasm of breast: Secondary | ICD-10-CM

## 2023-05-29 ENCOUNTER — Other Ambulatory Visit: Payer: Self-pay

## 2023-05-29 ENCOUNTER — Ambulatory Visit
Admission: EM | Admit: 2023-05-29 | Discharge: 2023-05-29 | Disposition: A | Discharge status: Home / Self Care | Payer: 59 | Attending: Family Medicine | Admitting: Family Medicine

## 2023-05-29 ENCOUNTER — Encounter: Payer: Self-pay | Admitting: Emergency Medicine

## 2023-05-29 DIAGNOSIS — J069 Acute upper respiratory infection, unspecified: Secondary | ICD-10-CM

## 2023-05-29 DIAGNOSIS — J4521 Mild intermittent asthma with (acute) exacerbation: Secondary | ICD-10-CM

## 2023-05-29 MED ORDER — DEXAMETHASONE SODIUM PHOSPHATE 10 MG/ML IJ SOLN
10.0000 mg | Freq: Once | INTRAMUSCULAR | Status: AC
  Administered 2023-05-29: 10 mg via INTRAMUSCULAR

## 2023-05-29 MED ORDER — ALBUTEROL SULFATE HFA 108 (90 BASE) MCG/ACT IN AERS: 2.0000 | INHALATION_SPRAY | RESPIRATORY_TRACT | 0 refills | Status: AC | PRN

## 2023-05-29 MED ORDER — BENZONATATE 100 MG PO CAPS: 100.0000 mg | ORAL_CAPSULE | Freq: Three times a day (TID) | ORAL | 0 refills | Status: AC

## 2023-05-29 NOTE — ED Triage Notes (Signed)
Pt reports dry,scratchy throat on Tuesday, pain in throat on Wednesday, and reports intermittent cough,nasal congestion for last several days as well. Reports lost voice since Thursday. Pt reports "I don't feel bad but my voice is gone."

## 2023-05-29 NOTE — ED Provider Notes (Signed)
[facility]    CSN: [id number] Arrival date & time: 05/29/23  0810      History   Chief Complaint Chief Complaint  Patient presents with   Hoarse    HPI [name] is a 62 y.o. female.   Patient presenting today with 5-day history of scratchy throat, congestion, hacking cough, wheezy, hoarseness.  Denies fever, chills, body aches, chest pain, shortness of breath, abdominal pain, nausea vomiting or diarrhea.  Has been trying Coricidin HBP and albuterol inhaler for asthma with minimal relief.    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/2018   Well woman exam with routine gynecological exam 08/30/2016   Burning with urination 08/30/2016   Hot flashes 08/30/2016   Hematuria 08/30/2016   Vaginal Pap smear following hysterectomy for malignancy 08/30/2016   Screening for colorectal cancer 08/30/2016   Mild intermittent asthma 12/15/2015   Allergic rhinitis due to pollen 12/15/2015   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   Hypertension 08/12/2015   Urinary incontinence 08/12/2015   Special screening for malignant neoplasms, colon     Past Surgical History:  Procedure Laterality Date   ABDOMINAL HYSTERECTOMY     BACK SURGERY     x3-ruptured disc   Bone spur Right    Foot   CARDIAC CATHETERIZATION  11/2009   Normal coronary arteries ([facility])    CHOLECYSTECTOMY     COLONOSCOPY N/A 07/15/2014   Procedure: COLONOSCOPY;  Surgeon: Corbin Ade, MD;  Location: [facility];  Service: Endoscopy;  Laterality: N/A;   COLONOSCOPY WITH PROPOFOL N/A 10/19/2021   Procedure: COLONOSCOPY WITH PROPOFOL;  Surgeon: Corbin Ade, MD;  Location: [facility];  Service: Endoscopy;  Laterality: N/A;  9:00 / ASA 2   CYST REMOVAL HAND Right    KNEE ARTHROSCOPY Left     OB History     Gravida  1   Para  1   Term  1   Preterm      AB      Living  1      SAB      IAB      Ectopic      Multiple      Live Births  1            Home Medications    Prior to Admission medications   Medication Sig Start Date End Date Taking? Authorizing Provider  albuterol (VENTOLIN HFA) 108 (90 Base) MCG/ACT inhaler Inhale 2 puffs into the lungs every 4 (four) hours as needed. 05/29/23  Yes Particia Nearing, PA-C  benzonatate (TESSALON) 100 MG capsule Take 1 capsule (100 mg total) by mouth every 8 (eight) hours. 05/29/23  Yes Particia Nearing, PA-C  acetaminophen (TYLENOL) 500 MG tablet Take 1,000 mg by mouth every 6 (six) hours as needed  for moderate pain.    [provider]  albuterol (PROAIR HFA) 108 (90 Base) MCG/ACT inhaler Inhale 2 puffs into the lungs every 4 (four) hours as needed for wheezing or shortness of breath. 12/15/15   Fletcher Anon, MD  cetirizine (ZYRTEC) 10 MG tablet Take 10 mg by mouth daily in the afternoon.    [provider]  cyclobenzaprine (FLEXERIL) 10 MG tablet Take 10 mg by mouth daily as needed for muscle spasms. 05/28/21   [provider]  diazepam (VALIUM) 2 MG tablet Take 2 mg by mouth as needed (anxiety when flying).    [provider]  diltiazem (CARDIZEM CD) 300 MG 24 hr capsule TAKE ONE CAPSULE BY MOUTH ONCE DAILY. 12/30/22   Jonelle Sidle, MD  EPINEPHrine 0.3 mg/0.3 mL IJ SOAJ injection Inject 0.3 mLs (0.3 mg total) into the muscle once. Patient taking  differently: Inject 0.3 mg into the muscle as needed for anaphylaxis. 12/15/15   Fletcher Anon, MD  levothyroxine (SYNTHROID) 100 MCG tablet Take 100 mcg by mouth See admin instructions. Take 100 mcg daily except skip dose on Sundays    [provider]  olmesartan (BENICAR) 20 MG tablet Take 20 mg by mouth daily. 04/29/21   [provider]  ondansetron (ZOFRAN-ODT) 8 MG disintegrating tablet Take 1 tablet (8 mg total) by mouth every 8 (eight) hours as needed for nausea or vomiting. 06/12/22   Theron Arista, PA-C  Polyethyl Glycol-Propyl Glycol (SYSTANE OP) Place 1 drop into both eyes daily as needed (dry eyes).    [provider]    Family History Family History  Problem Relation Age of Onset   COPD Mother    Cancer Mother        skin   Hernia Mother    Fibromyalgia Mother    Hypertension Mother    Asthma Mother    Other Father        open heart surgery   Hypertension Father    Hypertension Brother    Interstitial cystitis Daughter    Allergic rhinitis Daughter    Asthma Daughter    Cancer Maternal Grandmother        cervical   Heart attack Maternal Grandmother        died at age 34   Cancer Maternal Grandfather        colon   Cancer Paternal Grandmother        colon, cervical,melonoma   Other Paternal Grandmother        open heart surgery   Arthritis Paternal Grandfather    Leukemia Paternal Grandfather     Social History Social History   Tobacco Use   Smoking status: Never    Passive exposure: Never   Smokeless tobacco: Never  Vaping Use   Vaping status: Never Used  Substance Use Topics   Alcohol use: No   Drug use: No     Allergies   Other, Peanut-containing drug products, Covid-19 (mrna) vaccine, Dilaudid [hydromorphone hcl], Morphine and codeine, Phenergan [promethazine hcl], and Suprep bowel prep kit [na sulfate-k sulfate-mg sulf]   Review of Systems Review of Systems Per HPI  Physical Exam Triage Vital Signs ED Triage  Vitals  Encounter Vitals Group     BP 05/29/23 0903 (!) 163/95     Systolic BP Percentile --      Diastolic BP Percentile --      Pulse Rate 05/29/23 0903 70     Resp 05/29/23 0903 20  Temp 05/29/23 0903 97.9 F (36.6 C)     Temp Source 05/29/23 0903 Oral     SpO2 05/29/23 0903 96 %     Weight --      Height --      Head Circumference --      Peak Flow --      Pain Score 05/29/23 0902 0     Pain Loc --      Pain Education --      Exclude from Growth Chart --    No data found.  Updated Vital Signs BP (!) 163/95 (BP Location: Right Arm)   Pulse 70   Temp 97.9 F (36.6 C) (Oral)   Resp 20   SpO2 96%   Visual Acuity Right Eye Distance:   Left Eye Distance:   Bilateral Distance:    Right Eye Near:   Left Eye Near:    Bilateral Near:     Physical Exam Vitals and nursing note reviewed.  Constitutional:      Appearance: Normal appearance.  HENT:     Head: Atraumatic.     Right Ear: Tympanic membrane and external ear normal.     Left Ear: Tympanic membrane and external ear normal.     Nose: Rhinorrhea present.     Mouth/Throat:     Mouth: Mucous membranes are moist.     Pharynx: Posterior oropharyngeal erythema present.  Eyes:     Extraocular Movements: Extraocular movements intact.     Conjunctiva/sclera: Conjunctivae normal.  Cardiovascular:     Rate and Rhythm: Normal rate and regular rhythm.     Heart sounds: Normal heart sounds.  Pulmonary:     Effort: Pulmonary effort is normal.     Breath sounds: Wheezing present. No rales.  Musculoskeletal:        General: Normal range of motion.     Cervical back: Normal range of motion and neck supple.  Skin:    General: Skin is warm and dry.  Neurological:     Mental Status: She is alert and oriented to person, place, and time.  Psychiatric:        Mood and Affect: Mood normal.        Thought Content: Thought content normal.      UC Treatments / Results  Labs (all labs ordered are listed, but only  abnormal results are displayed) Labs Reviewed - No data to display  EKG   Radiology No results found.  Procedures Procedures (including critical care time)  Medications Ordered in UC Medications  dexamethasone (DECADRON) injection 10 mg (10 mg Intramuscular Given 05/29/23 0921)    Initial Impression / Assessment and Plan / UC Course  I have reviewed the triage vital signs and the nursing notes.  Pertinent labs & imaging results that were available during my care of the patient were reviewed by me and considered in my medical decision making (see chart for details).     Hypertensive in triage, otherwise vital signs within normal limits.  She is overall well-appearing and in no acute distress.  Suspect viral respiratory infection causing asthma exacerbation.  Treat with IM Decadron, Tessalon, albuterol, supportive over-the-counter medications and home care.  Return for worsening symptoms.  Final Clinical Impressions(s) / UC Diagnoses   Final diagnoses:  Viral URI with cough  Mild intermittent asthma with acute exacerbation   Discharge Instructions   None    ED Prescriptions     Medication Sig Dispense Auth. Provider   benzonatate (TESSALON) 100  MG capsule Take 1 capsule (100 mg total) by mouth every 8 (eight) hours. 21 capsule Particia Nearing, [place]   albuterol (VENTOLIN HFA) 108 (90 Base) MCG/ACT inhaler Inhale 2 puffs into the lungs every 4 (four) hours as needed. 18 g Particia Nearing, [place]      PDMP not reviewed this encounter.   Particia Nearing, [place] 05/29/23 1011

## 2023-05-31 ENCOUNTER — Ambulatory Visit
Admission: RE | Admit: 2023-05-31 | Discharge: 2023-05-31 | Disposition: A | Discharge status: Home / Self Care | Payer: 59 | Source: Ambulatory Visit | Attending: Family Medicine | Admitting: Family Medicine

## 2023-05-31 DIAGNOSIS — R296 Repeated falls: Secondary | ICD-10-CM

## 2023-05-31 DIAGNOSIS — R519 Headache, unspecified: Secondary | ICD-10-CM

## 2023-06-02 DIAGNOSIS — J4520 Mild intermittent asthma, uncomplicated: Secondary | ICD-10-CM | POA: Diagnosis not present

## 2023-06-02 DIAGNOSIS — J329 Chronic sinusitis, unspecified: Secondary | ICD-10-CM | POA: Diagnosis not present

## 2023-06-08 DIAGNOSIS — Z6825 Body mass index (BMI) 25.0-25.9, adult: Secondary | ICD-10-CM | POA: Diagnosis not present

## 2023-06-08 DIAGNOSIS — Z0000 Encounter for general adult medical examination without abnormal findings: Secondary | ICD-10-CM | POA: Diagnosis not present

## 2023-06-08 DIAGNOSIS — E663 Overweight: Secondary | ICD-10-CM | POA: Diagnosis not present

## 2023-06-08 DIAGNOSIS — Z1331 Encounter for screening for depression: Secondary | ICD-10-CM | POA: Diagnosis not present

## 2023-06-08 DIAGNOSIS — J4520 Mild intermittent asthma, uncomplicated: Secondary | ICD-10-CM | POA: Diagnosis not present

## 2023-06-10 ENCOUNTER — Ambulatory Visit
Admission: RE | Admit: 2023-06-10 | Discharge: 2023-06-10 | Disposition: A | Discharge status: Home / Self Care | Payer: 59 | Source: Ambulatory Visit | Attending: Family Medicine | Admitting: Family Medicine

## 2023-06-10 DIAGNOSIS — Z1239 Encounter for other screening for malignant neoplasm of breast: Secondary | ICD-10-CM

## 2023-06-10 DIAGNOSIS — Z1231 Encounter for screening mammogram for malignant neoplasm of breast: Secondary | ICD-10-CM | POA: Diagnosis not present

## 2023-06-12 ENCOUNTER — Other Ambulatory Visit (HOSPITAL_COMMUNITY): Payer: Self-pay | Admitting: Family Medicine

## 2023-06-12 ENCOUNTER — Ambulatory Visit (HOSPITAL_COMMUNITY)
Admission: RE | Admit: 2023-06-12 | Discharge: 2023-06-12 | Disposition: A | Discharge status: Home / Self Care | Payer: 59 | Source: Ambulatory Visit | Attending: Family Medicine | Admitting: Family Medicine

## 2023-06-12 DIAGNOSIS — J189 Pneumonia, unspecified organism: Secondary | ICD-10-CM | POA: Insufficient documentation

## 2023-06-12 DIAGNOSIS — R059 Cough, unspecified: Secondary | ICD-10-CM | POA: Diagnosis not present

## 2023-06-25 ENCOUNTER — Other Ambulatory Visit: Payer: Self-pay | Admitting: Family Medicine

## 2023-08-22 DIAGNOSIS — D2261 Melanocytic nevi of right upper limb, including shoulder: Secondary | ICD-10-CM | POA: Diagnosis not present

## 2023-08-22 DIAGNOSIS — D224 Melanocytic nevi of scalp and neck: Secondary | ICD-10-CM | POA: Diagnosis not present

## 2023-08-22 DIAGNOSIS — D225 Melanocytic nevi of trunk: Secondary | ICD-10-CM | POA: Diagnosis not present

## 2023-08-22 DIAGNOSIS — D2262 Melanocytic nevi of left upper limb, including shoulder: Secondary | ICD-10-CM | POA: Diagnosis not present

## 2023-08-22 DIAGNOSIS — L309 Dermatitis, unspecified: Secondary | ICD-10-CM | POA: Diagnosis not present

## 2023-08-22 DIAGNOSIS — D1801 Hemangioma of skin and subcutaneous tissue: Secondary | ICD-10-CM | POA: Diagnosis not present

## 2023-08-22 DIAGNOSIS — D2272 Melanocytic nevi of left lower limb, including hip: Secondary | ICD-10-CM | POA: Diagnosis not present

## 2023-08-22 DIAGNOSIS — D2271 Melanocytic nevi of right lower limb, including hip: Secondary | ICD-10-CM | POA: Diagnosis not present

## 2023-08-22 DIAGNOSIS — L111 Transient acantholytic dermatosis [Grover]: Secondary | ICD-10-CM | POA: Diagnosis not present

## 2023-08-22 DIAGNOSIS — L812 Freckles: Secondary | ICD-10-CM | POA: Diagnosis not present

## 2023-08-22 DIAGNOSIS — L821 Other seborrheic keratosis: Secondary | ICD-10-CM | POA: Diagnosis not present

## 2023-08-22 DIAGNOSIS — Z85828 Personal history of other malignant neoplasm of skin: Secondary | ICD-10-CM | POA: Diagnosis not present

## 2023-10-12 ENCOUNTER — Encounter: Payer: Self-pay | Admitting: Student

## 2023-10-12 NOTE — Progress Notes (Deleted)
   Cardiology Office Note    Date:  10/12/2023  ID:  [name], DOB 1960-12-26, MRN [id number] Cardiologist: Teddie Favre, MD    History of Present Illness:    Ann NATHANSON is a 63 y.o. female with past medical history of palpitations (PAC's, PVC's and brief SVT by prior monitor in 2020), HTN and Hypothyroidism who presents to the office today for 16-month follow-up.  She was last examined by Dr. Londa Rival in 03/2023 and denied any recent chest pain or frequent palpitations at that time. No changes were made to her cardiac medications and she was continued on Cardizem  CD 300 mg daily and Olmesartan 20 mg daily.  ROS: ***  Studies Reviewed:   EKG: EKG is*** ordered today and demonstrates ***   EKG Interpretation Date/Time:    Ventricular Rate:    PR Interval:    QRS Duration:    QT Interval:    QTC Calculation:   R Axis:      Text Interpretation:         Cardiac Monitor: 08/2018 7-day event recorder reviewed. Sinus rhythm was present throughout. Heart rate ranged from 44 bpm up to 136 bpm with average heart rate 73 bpm. Rare PACs and PVCs were noted,each representing less than 1% of total beats. Ectopic beats were isolated with the exception of a brief 5 beat burst of SVT and a few atrial couplets. No obvious atrial fibrillation or atrial flutter. No pauses.   Echocardiogram: 09/2018 IMPRESSIONS     1. The left ventricle has hyperdynamic systolic function, with an  ejection fraction of >65%. The cavity size was normal. Left ventricular  diastolic parameters were normal. No evidence of left ventricular regional  wall motion abnormalities.   2. The right ventricle has normal systolic function. The cavity was  normal. There is no increase in right ventricular wall thickness. Right  ventricular systolic pressure. Normal estimated RVSP with an estimated  pressure of 16.4 mmHg.   3. The mitral valve is normal in structure. No diagnostic evidence of  mitral valve  prolapse.   4. The tricuspid valve is normal in structure.   5. The aortic valve is tricuspid.   6. The aortic root is normal in size and structure.    Risk Assessment/Calculations:   {Does this patient have ATRIAL FIBRILLATION?[phone]} No BP recorded.  {Refresh Note OR Click here to enter BP  :1}***         Physical Exam:   VS:  There were no vitals taken for this visit.   Wt Readings from Last 3 Encounters:  04/20/23 155 lb 9.6 oz (70.6 kg)  10/11/22 144 lb 3.2 oz (65.4 kg)  06/12/22 154 lb (69.9 kg)     GEN: Well nourished, well developed in no acute distress NECK: No JVD; No carotid bruits CARDIAC: ***RRR, no murmurs, rubs, gallops RESPIRATORY:  Clear to auscultation without rales, wheezing or rhonchi  ABDOMEN: Appears non-distended. No obvious abdominal masses. EXTREMITIES: No clubbing or cyanosis. No edema.  Distal pedal pulses are 2+ bilaterally.   Assessment and Plan:   1. Palpitations - ***  2. HTN - ***  Signed, Dorma Gash, PA-C

## 2023-10-13 ENCOUNTER — Ambulatory Visit: Payer: Self-pay | Attending: Student | Admitting: Student

## 2023-10-31 ENCOUNTER — Other Ambulatory Visit: Payer: Self-pay | Admitting: Cardiology

## 2023-11-21 DIAGNOSIS — R6889 Other general symptoms and signs: Secondary | ICD-10-CM | POA: Diagnosis not present

## 2023-11-21 DIAGNOSIS — E663 Overweight: Secondary | ICD-10-CM | POA: Diagnosis not present

## 2023-11-21 DIAGNOSIS — U071 COVID-19: Secondary | ICD-10-CM | POA: Diagnosis not present

## 2023-11-21 DIAGNOSIS — Z6825 Body mass index (BMI) 25.0-25.9, adult: Secondary | ICD-10-CM | POA: Diagnosis not present

## 2023-12-06 DIAGNOSIS — D224 Melanocytic nevi of scalp and neck: Secondary | ICD-10-CM | POA: Diagnosis not present

## 2023-12-06 DIAGNOSIS — D485 Neoplasm of uncertain behavior of skin: Secondary | ICD-10-CM | POA: Diagnosis not present

## 2024-02-09 DIAGNOSIS — D485 Neoplasm of uncertain behavior of skin: Secondary | ICD-10-CM | POA: Diagnosis not present

## 2024-02-09 DIAGNOSIS — L988 Other specified disorders of the skin and subcutaneous tissue: Secondary | ICD-10-CM | POA: Diagnosis not present

## 2024-03-27 DIAGNOSIS — Z6825 Body mass index (BMI) 25.0-25.9, adult: Secondary | ICD-10-CM | POA: Diagnosis not present

## 2024-03-27 DIAGNOSIS — J4520 Mild intermittent asthma, uncomplicated: Secondary | ICD-10-CM | POA: Diagnosis not present

## 2024-03-27 DIAGNOSIS — E039 Hypothyroidism, unspecified: Secondary | ICD-10-CM | POA: Diagnosis not present

## 2024-03-27 DIAGNOSIS — I10 Essential (primary) hypertension: Secondary | ICD-10-CM | POA: Diagnosis not present

## 2024-03-27 DIAGNOSIS — Z139 Encounter for screening, unspecified: Secondary | ICD-10-CM | POA: Diagnosis not present

## 2024-05-15 ENCOUNTER — Other Ambulatory Visit: Payer: Self-pay | Admitting: *Deleted

## 2024-05-15 ENCOUNTER — Other Ambulatory Visit: Payer: Self-pay | Admitting: Family Medicine

## 2024-05-15 DIAGNOSIS — Z1231 Encounter for screening mammogram for malignant neoplasm of breast: Secondary | ICD-10-CM

## 2024-06-11 ENCOUNTER — Ambulatory Visit
Admission: RE | Admit: 2024-06-11 | Discharge: 2024-06-11 | Disposition: A | Discharge status: Home / Self Care | Source: Ambulatory Visit | Attending: Family Medicine | Admitting: Family Medicine

## 2024-06-11 DIAGNOSIS — Z1231 Encounter for screening mammogram for malignant neoplasm of breast: Secondary | ICD-10-CM
